# Patient Record
Sex: Male | Born: 1960
Health system: Southern US, Community
[De-identification: ages and names within clinical notes are randomized; demographics above are authoritative.]

## PROBLEM LIST (undated history)

## (undated) DIAGNOSIS — E119 Type 2 diabetes mellitus without complications: Secondary | ICD-10-CM

---

## 2006-03-27 ENCOUNTER — Emergency Department (HOSPITAL_COMMUNITY): Admission: EM | Admit: 2006-03-27 | Discharge: 2006-03-27 | Payer: Self-pay | Admitting: Emergency Medicine

## 2008-08-14 ENCOUNTER — Emergency Department (HOSPITAL_COMMUNITY): Admission: EM | Admit: 2008-08-14 | Discharge: 2008-08-14 | Payer: Self-pay | Admitting: Emergency Medicine

## 2008-09-06 ENCOUNTER — Ambulatory Visit: Payer: Self-pay | Admitting: Cardiology

## 2008-09-06 ENCOUNTER — Inpatient Hospital Stay (HOSPITAL_COMMUNITY): Admission: EM | Admit: 2008-09-06 | Discharge: 2008-09-11 | Payer: Self-pay | Admitting: Emergency Medicine

## 2008-09-23 ENCOUNTER — Ambulatory Visit: Payer: Self-pay | Admitting: Internal Medicine

## 2008-09-23 ENCOUNTER — Encounter (INDEPENDENT_AMBULATORY_CARE_PROVIDER_SITE_OTHER): Payer: Self-pay | Admitting: Family Medicine

## 2008-09-23 LAB — CONVERTED CEMR LAB: Microalb, Ur: 2.57 mg/dL — ABNORMAL HIGH (ref 0.00–1.89)

## 2008-10-08 ENCOUNTER — Ambulatory Visit: Payer: Self-pay | Admitting: *Deleted

## 2008-10-26 ENCOUNTER — Encounter (INDEPENDENT_AMBULATORY_CARE_PROVIDER_SITE_OTHER): Payer: Self-pay | Admitting: Family Medicine

## 2008-10-26 ENCOUNTER — Ambulatory Visit: Payer: Self-pay | Admitting: Internal Medicine

## 2008-10-26 LAB — CONVERTED CEMR LAB
ALT: 12 units/L (ref 0–53)
AST: 12 units/L (ref 0–37)
Alkaline Phosphatase: 65 units/L (ref 39–117)
Cholesterol: 138 mg/dL (ref 0–200)
Creatinine, Ser: 1.2 mg/dL (ref 0.40–1.50)
Total Bilirubin: 0.7 mg/dL (ref 0.3–1.2)
Total CHOL/HDL Ratio: 3.6
VLDL: 20 mg/dL (ref 0–40)

## 2008-11-26 ENCOUNTER — Ambulatory Visit: Payer: Self-pay | Admitting: Family Medicine

## 2009-01-26 ENCOUNTER — Ambulatory Visit: Payer: Self-pay | Admitting: Internal Medicine

## 2009-04-23 ENCOUNTER — Ambulatory Visit: Payer: Self-pay | Admitting: Family Medicine

## 2009-04-23 ENCOUNTER — Encounter (INDEPENDENT_AMBULATORY_CARE_PROVIDER_SITE_OTHER): Payer: Self-pay | Admitting: Adult Health

## 2009-04-23 LAB — CONVERTED CEMR LAB
AST: 12 units/L (ref 0–37)
Albumin: 4.2 g/dL (ref 3.5–5.2)
Alkaline Phosphatase: 48 units/L (ref 39–117)
BUN: 19 mg/dL (ref 6–23)
HDL: 33 mg/dL — ABNORMAL LOW (ref >39)
LDL Cholesterol: 47 mg/dL (ref 0–99)
Potassium: 4.3 meq/L (ref 3.5–5.3)
Sodium: 142 meq/L (ref 135–145)
Total CHOL/HDL Ratio: 2.9

## 2009-07-23 ENCOUNTER — Ambulatory Visit: Payer: Self-pay | Admitting: Family Medicine

## 2010-12-26 LAB — GLUCOSE, CAPILLARY: Glucose-Capillary: 223 mg/dL — ABNORMAL HIGH (ref 70–99)

## 2010-12-26 LAB — BASIC METABOLIC PANEL
Calcium: 8.4 mg/dL (ref 8.4–10.5)
Calcium: 8.8 mg/dL (ref 8.4–10.5)
Chloride: 102 mEq/L (ref 96–112)
Creatinine, Ser: 1.05 mg/dL (ref 0.4–1.5)
Creatinine, Ser: 1.05 mg/dL (ref 0.4–1.5)
GFR calc Af Amer: >60 mL/min (ref >60)
GFR calc Af Amer: >60 mL/min (ref >60)
GFR calc non Af Amer: >60 mL/min (ref >60)

## 2011-01-24 NOTE — Discharge Summary (Signed)
NAMEARHAN, MCMANAMON                ACCOUNT NO.:  [id number]   MEDICAL RECORD NO.:  [id number]          PATIENT TYPE:  INP   LOCATION:  4707                         FACILITY:  [facility]   PHYSICIAN:  Altha Harm, MDDATE OF BIRTH:  08-16-1961   DATE OF ADMISSION:  09/06/2008  DATE OF DISCHARGE:  09/11/2008                               DISCHARGE SUMMARY   DISCHARGE DISPOSITION:  Home.   FINAL DISCHARGE DIAGNOSES:  1. Diabetes type 2.  2. Diabetic ketoacidosis, resolved.  3. Hyperlipidemia.  4. Sinus tachycardia.   DISCHARGE MEDICATIONS:  1. Glipizide 10 mg p.o. b.i.d.  2. Aspirin 81 mg p.o. daily.  3. Mag oxide 400 mg p.o. t.i.d.  4. Pravastatin 20 mg p.o. nightly.  5. Lantus 10 units subcu at bedtime.  6. Lisinopril 5 mg p.o. daily.   CONSULTANTS:  Madolyn Frieze. Jens Som, MD, [facility], Cardiology.   PROCEDURE:  None.   DIAGNOSTIC STUDIES:  1. Portable chest x-ray, two-view done on admission which shows no      active cardiopulmonary disease.  2. Portable chest x-ray done after a PICC line placement, which shows      PICC line in the SVC.   CODE STATUS:  Full code.   ALLERGIES:  No known drug allergies.   Primary care Nayson Traweek initially unassigned.  The patient now signed to  [facility], an appointment on September 24, 2007.   CHIEF COMPLAINT:  New onset diabetes, DKA.   HISTORY OF PRESENT ILLNESS:  Please refer to the H and P dictated by Dr.  Allena Katz for details of the HPI.   HOSPITAL COURSE:  1. The patient was new onset diabetes and diabetic ketoacidosis.  The      patient was admitted and placed on insulin drip.  He had resolution      of his diabetic ketoacidosis and was transitioned onto bolus and      basal insulin.  A GAD-69 was checked and the patient was found to      be in normal limits with eliminated type 1 as the cause of this.      The patient was then started on oral anti-hypoglycemics and will be      sent home on glipizide 10 mg b.i.d. in addition to a  bolus of      Lantus in the evenings.  The patient received diabetic education      while hospitalized in terms of symptoms of hypoglycemia, diet, and      carbohydrate exchanges, and expected surveillance since followup      for his diagnosis.  2. Hyperlipidemia.  The patient was found to have a markedly elevated      lipid level hospitalized and was started on pravastatin.  He is to      continue to have this cholesterol checked and see if this requires      titration of his medication.  3. Sinus tachycardia.  The patient was found to be mildly sinus      tachycardic.  The patient was set to be discharged; however, his      sister who  he has designated as his spokesperson advocate requested      a consult by Cardiology.  The patient was seen by Cardiology who      concurred that there is no further workup necessary and recommended      echo to be done as an outpatient.  I will defer to [facility] to      further evaluate this patient with an echocardiogram.  The TSH was      found to be within normal limits.  4. Prior history of hypertension.  The patient reports that he had a      prior history of hypertension; however, during this hospitalization      here, he was found to be only mildly hypertensive with diastolic      blood pressures in the 90s.  The patient was started on lisinopril      5 mg p.o. daily.   DIETARY RESTRICTIONS:  The patient should be on a diabetic, low-  cholesterol, low-sodium diet.   PHYSICAL RESTRICTIONS:  None.   FOLLOWUP:  The patient has an appointment scheduled with [facility] for  September 24, 2007, at 9:30 a.m. and is to follow up at that time.      Altha Harm, MD  Electronically Signed     MAM/MEDQ  D:  09/11/2008  T:  09/11/2008  Job:  [id number]   cc:   Melvern Banker

## 2011-01-24 NOTE — Consult Note (Signed)
[name], [name]                ACCOUNT NO.:  [id number]   MEDICAL RECORD NO.:  [id number]          PATIENT TYPE:  INP   LOCATION:  4707                         FACILITY:  [facility]   PHYSICIAN:  Isaac Frieze. Jens Som, MD, FACCDATE OF BIRTH:  1961/05/25   DATE OF CONSULTATION:  09/10/2008  DATE OF DISCHARGE:                                 CONSULTATION   Isaac [name] is a 50 year old gentleman with recently diagnosed diabetes  mellitus, who I am asked to evaluate for tachycardia.  The patient has  no prior cardiac history.  He typically does not have dyspnea on  exertion, orthopnea, PND, pedal edema, presyncope, syncope, or  exertional chest pain.  He occasionally feels brief skipped in his  heart at home, but these are not sustained nor are they particularly  bothersome.  The patient was admitted to [facility] by the  [facility] on September 06, 2008.  At that time, he was  complaining of abdominal pain.  The pain was in the left upper abdomen.  This was associated with decreased p.o. intake, weakness, and mild  dizziness, particularly with standing.  Note, he was not having chest  pain, palpitations, or increased shortness of breath.  Also note that he  had been seen recently for an abscess in his left inner thigh, which had  been treated and improved.  At the time of his admission, he was found  to be in DKA.  His initial potassium was 5.5 with a BUN of 24 and  creatinine of 2.08.  His glucose was 516 with a CO2 of 8.  He also had  ketones that were moderate in his blood.  Amylase was elevated at 404  and lipase was elevated at 302.  Also note that he had mild elevation in  liver functions of alkaline phosphatase at 120 and bilirubin at 2.0.  The patient was treated with IV fluids, as well as an insulin drip and  improved significantly.  He now feels back to baseline with no  complaints; however, his heart rate has been noted to be elevated in the  100-120 range.  Because  of the above, we were asked to further evaluate.  Note, he states he had a brief episode of palpitations in the hospital  but nothing sustained.  He has not had chest pain or shortness of  breath.  Also note that he had initial set of markers when he presented  to the hospital that were normal.   His present medications include:  1. Subcutaneous heparin.  2. Aspirin 81 mg p.o. daily.  3. Magnesium oxide 400 mg p.o. t.i.d.  4. Protonix 40 mg p.o. daily.  5. Glipizide 5 mg p.o. b.i.d. and p.r.n. medications.   He has no known drug allergies.   SOCIAL HISTORY:  He has remote history of tobacco use but has not smoked  in years by his report.  He occasionally consumes alcohol.  He denies  any drug use.  He is single.  He is unemployed at present.   His family history is negative for coronary artery disease by  his report  in his immediate family.  There is a history of hypertension.   PAST MEDICAL HISTORY:  The patient does have a history of hypertension  but now diabetes mellitus.  There is no hyperlipidemia.  He does have a  history of asthma.  He has had previous surgery for a lazy eye.   REVIEW OF SYSTEMS:  He denies any headaches or fevers or chills.  There  is no productive cough or hemoptysis.  There is no dysphagia,  odynophagia, melena, or hematochezia.  There is no dysuria or hematuria.  There is no rash or seizure activity.  There is no orthopnea, PND, or  pedal edema.  His symptoms that were present on admission are now  absent.  Remaining systems are negative.   PHYSICAL EXAMINATION:  VITAL SIGNS:  Today shows a blood pressure of  125/82  and his pulse is 52.  His temperature is 97.4.  He is 97% on  room air.  GENERAL:  He is well developed and mildly obese.  He is in no acute  distress at present. He does not appear to be depressed.  There is no  peripheral clubbing.  SKIN:  Warm and dry.  BACK:  Normal.  HEENT:  Normal with normal eyelids.  NECK:  Supple with normal  upstroke bilaterally.  No bruits heard.  There  is no jugular venous distention.  I cannot appreciate thyromegaly.  CHEST:  Clear to auscultation.  No expansion.  CARDIOVASCULAR:  Regular rate and rhythm with normal S1 and S2.  There  are no murmurs, rubs, or gallops noted.  ABDOMEN:  Nontender, nondistended, positive bowel sounds.  No  hepatosplenomegaly.  No mass appreciated.  There is no abdominal bruit.  He has 2+ femoral pulses bilaterally.  No bruits.  EXTREMITIES:  No edema that I can palpate.  No cords.  He has 2+  dorsalis pedis pulses bilaterally.  NEUROLOGIC:  Grossly intact.   His electrocardiogram on September 06, 2008, showed a sinus tachycardia,  rate of 117.  The axis was normal.  There was inferolateral T-wave  inversion.  There is right atrial enlargement.  Again, he had 1 set of  markers when he came in that were normal.  His sodium today is 139 with  a potassium of 3.8.  His BUN and creatinine are 10 and 0.82.  He has had  1 or 2 blood cultures that are positive for Gram-positive rods.  Chest x-  ray showed no active cardiopulmonary disease.  His last BNP on September 07, 2008, showed that his liver functions were minimally elevated with  an SGOT of 69 and SGPT of 75.   DIAGNOSES:  1. Tachycardia - I am asked to evaluate Isaac [name] for a sinus      tachycardia.  He is not having a significant amount of symptoms.  I      think this is most likely related to his initial presentation of      diabetic ketoacidosis and dehydration.  I do not think this will      require further therapy.  For now, we would recommend checking a      TSH.  Note, his hemoglobin/hematocrit were normal at the time of      admission.  Also note that his enzymes are negative x1, therefore,      excluding the myocardial infarction as a cause of his diabetic      ketoacidosis.  I would plan to check an  echocardiogram to quantify      his left ventricular function.  This could be certainly done as  an      outpatient.  If it is normal, then I would not pursue further      cardiac evaluation.  2. Newly diagnosed diabetes mellitus - management per the primary      service.  The etiology of his diabetic ketoacidosis is unclear.      Again, his enzymes were negative.  He did have a small abscess      drained from his left thigh and certainly infection could      potentially have caused the above.  I will leave management of this      and his positive blood culture to the primary team.  3. Mildly increased liver functions - management per primary care.  4. We would be happy to follow while he is in the hospital.      Isaac Frieze. Jens Som, MD, [facility]  Electronically Signed     BSC/MEDQ  D:  09/10/2008  T:  09/11/2008  Job:  [id number]

## 2011-01-24 NOTE — H&P (Signed)
Isaac [name], Isaac [name]                ACCOUNT NO.:  [id number]   MEDICAL RECORD NO.:  [id number]          PATIENT TYPE:  INP   LOCATION:  4707                         FACILITY:  [facility]   PHYSICIAN:  Joylene John, MD       DATE OF BIRTH:  1960-12-10   DATE OF ADMISSION:  09/06/2008  DATE OF DISCHARGE:                              HISTORY & PHYSICAL   REASON FOR ADMISSION:  New onset diabetes and diabetic ketoacidosis.   HISTORY OF PRESENT ILLNESS:  This is a 50 year old African American male  with no significant past medical history coming in with 2 weeks of  abdominal pain, decreased p.o. intake, increased weakness, increased  urination, blurred vision, and thrush.  According to the patient, he has  not seen a doctor for regular physical or had lab work in several years.  He was recently here in the ER for a groin abscess which was drained;  however, no labs were done at that time.  The patient denies any fevers,  chills, shortness of breath, or problems with urination.  The patient  denies any chest pain.  No similar episodes in the past.   PAST MEDICAL HISTORY:  No significant past medical history except for  high blood pressure; however, the patient self discontinued his  medications since he was not feeling well.   SOCIAL HISTORY:  He is currently laid off.  He used to work in a Journalist, newspaper.  Ex-smoker, last smoke was more than a decade ago.  No tobacco or  alcohol.   FAMILY HISTORY:  Positive for diabetes, high blood pressure, and  coronary artery disease.   ALLERGIES:  No allergies to food or medicine.   REVIEW OF SYSTEM:  Positive for weakness, increased thrush, increased  urination, and blurred vision along with abdominal pain, otherwise a 14-  point review of system was negative.   PHYSICAL EXAMINATION:  VITAL SIGNS:  Temperature is 96.3, pulse ranging  from 130-119, blood pressure 144/91 to 136/108, and respirations 20-18.  The patient is saturating 100% on room air.  GENERAL:  This is an [place] male in no acute distress, awake  and alert, talking in full sentences.  HEENT:  No icterus appreciated.  No pallor appreciated.  However, he  does have dry mucous membranes and poor dentition.  NECK:  No JVD appreciated.  LUNGS:  Clear to auscultation.  CARDIOVASCULAR:  Regular rate and rhythm.  He is tachycardic.  ABDOMEN:  Soft.  Bowel sounds are present.  There is mild epigastric  tenderness.  No rebound or guarding appreciated.  EXTREMITIES:  No lower extremity edema appreciated.   Pertinent imaging shows a chest x-ray without any acute disease.  Lab  shows a white count of 10.5, neutrophil 88%, hemoglobin 16.8, crit 51.8,  and platelets 236.  Sodium 131, potassium 5.5, chloride 96, bicarb 8,  BUN 24, creatinine 2.08, albumin 3.8, glucose 516, calcium 9.3,  corrected sodium is 138, anion gap is 29, AST is 14, and ALT is 21.   ASSESSMENT AND PLAN:  1. This is a 50 year old male, who was  coming in with new onset of      diabetic ketoacidosis.  One plan is to admit the patient to a tele      bed, do serial cardiac enzymes to make sure that this is not a      cardiac event that regard him going into diabetic ketoacidosis.      Two is to hydrate this patient.  The patient has received 2 liters      of normal saline according to the ER MD.  We will make sure that he      get normal saline at about 250 mL an hour.  Once his chemistry      sugar is less than 250, we will change to D5 0.5 normal.  The      patient will be kept n.p.o. for next 24 hours until the abdominal      pain resolves.  After that diet can be slowly resumed.  Labs will      be checked frequently to make sure his potassium and magnesium are      within normal limits and replete as needed.  We will also check      urinalysis and culture to make sure that the patient does not have      a urinary tract infection.  We will check serum ketones, serum      osmolality, ABG, and  hemoglobin A1c.  We will get C-peptide level      and a GAD level as well since this is new onset diabetes to help to      differentiate whether this is type 1 versus type 2, even though      type 2 is more common, type 1 can happen.  2. Abdominal pain.  We will keep the patient n.p.o. for now.  We will      check amylase and lipase level.  If abdominal pain persists, then      to consider CT abdomen at a later time.  3. Blood pressure.  Blood pressure is not very high, systolic in the      142 range right now.  We will hold blood pressure medications right      now.  We will need to reassess at a later date and if the blood      pressure medications need to be started, I would consider an ACE      inhibitor since the patient is a diabetic especially if he has      protein in his urine.  4. The patient will need diabetic education and social worker to help      deal with his new-onset diabetes.  5. The patient is currently on insulin drip.  Once anion gap closes,      then he can be transitioned to standing insulin with food      Joylene John, MD  Electronically Signed     RP/MEDQ  D:  09/06/2008  T:  09/07/2008  Job:  [id number]

## 2011-06-16 LAB — COMPREHENSIVE METABOLIC PANEL
ALT: 14 U/L (ref 0–53)
ALT: 14 U/L (ref 0–53)
ALT: 21 U/L (ref 0–53)
AST: 12 U/L (ref 0–37)
Alkaline Phosphatase: 80 U/L (ref 39–117)
Alkaline Phosphatase: 86 U/L (ref 39–117)
BUN: 11 mg/dL (ref 6–23)
BUN: 9 mg/dL (ref 6–23)
CO2: 23 mEq/L (ref 19–32)
CO2: 24 mEq/L (ref 19–32)
CO2: 27 mEq/L (ref 19–32)
CO2: 8 mEq/L — CL (ref 19–32)
Calcium: 7.8 mg/dL — ABNORMAL LOW (ref 8.4–10.5)
Calcium: 7.8 mg/dL — ABNORMAL LOW (ref 8.4–10.5)
Calcium: 9.3 mg/dL (ref 8.4–10.5)
Chloride: 101 mEq/L (ref 96–112)
Creatinine, Ser: 0.78 mg/dL (ref 0.4–1.5)
Creatinine, Ser: 1.34 mg/dL (ref 0.4–1.5)
Creatinine, Ser: 2.08 mg/dL — ABNORMAL HIGH (ref 0.4–1.5)
GFR calc non Af Amer: 35 mL/min — ABNORMAL LOW (ref >60)
GFR calc non Af Amer: 58 mL/min — ABNORMAL LOW (ref >60)
GFR calc non Af Amer: >60 mL/min (ref >60)
GFR calc non Af Amer: >60 mL/min (ref >60)
Glucose, Bld: 135 mg/dL — ABNORMAL HIGH (ref 70–99)
Glucose, Bld: 154 mg/dL — ABNORMAL HIGH (ref 70–99)
Glucose, Bld: 160 mg/dL — ABNORMAL HIGH (ref 70–99)
Glucose, Bld: 516 mg/dL (ref 70–99)
Glucose, Bld: 93 mg/dL (ref 70–99)
Potassium: 2.8 mEq/L — ABNORMAL LOW (ref 3.5–5.1)
Potassium: 4.2 mEq/L (ref 3.5–5.1)
Sodium: 136 mEq/L (ref 135–145)
Sodium: 139 mEq/L (ref 135–145)
Total Bilirubin: 0.5 mg/dL (ref 0.3–1.2)
Total Bilirubin: 2 mg/dL — ABNORMAL HIGH (ref 0.3–1.2)
Total Protein: 5.1 g/dL — ABNORMAL LOW (ref 6.0–8.3)
Total Protein: 6.2 g/dL (ref 6.0–8.3)

## 2011-06-16 LAB — BASIC METABOLIC PANEL
BUN: 10 mg/dL (ref 6–23)
BUN: 3 mg/dL — ABNORMAL LOW (ref 6–23)
BUN: 4 mg/dL — ABNORMAL LOW (ref 6–23)
BUN: 4 mg/dL — ABNORMAL LOW (ref 6–23)
CO2: 24 mEq/L (ref 19–32)
CO2: 24 mEq/L (ref 19–32)
CO2: 24 mEq/L (ref 19–32)
CO2: 25 mEq/L (ref 19–32)
CO2: 26 mEq/L (ref 19–32)
CO2: 26 mEq/L (ref 19–32)
Calcium: 7.7 mg/dL — ABNORMAL LOW (ref 8.4–10.5)
Calcium: 7.9 mg/dL — ABNORMAL LOW (ref 8.4–10.5)
Calcium: 8 mg/dL — ABNORMAL LOW (ref 8.4–10.5)
Calcium: 8.1 mg/dL — ABNORMAL LOW (ref 8.4–10.5)
Calcium: 8.1 mg/dL — ABNORMAL LOW (ref 8.4–10.5)
Calcium: 8.3 mg/dL — ABNORMAL LOW (ref 8.4–10.5)
Calcium: 8.8 mg/dL (ref 8.4–10.5)
Chloride: 100 mEq/L (ref 96–112)
Chloride: 102 mEq/L (ref 96–112)
Chloride: 105 mEq/L (ref 96–112)
Chloride: 95 mEq/L — ABNORMAL LOW (ref 96–112)
Chloride: 95 mEq/L — ABNORMAL LOW (ref 96–112)
Chloride: 98 mEq/L (ref 96–112)
Chloride: 98 mEq/L (ref 96–112)
Creatinine, Ser: 0.88 mg/dL (ref 0.4–1.5)
Creatinine, Ser: 0.92 mg/dL (ref 0.4–1.5)
Creatinine, Ser: 0.96 mg/dL (ref 0.4–1.5)
Creatinine, Ser: 1.16 mg/dL (ref 0.4–1.5)
Creatinine, Ser: 1.17 mg/dL (ref 0.4–1.5)
Creatinine, Ser: 1.45 mg/dL (ref 0.4–1.5)
GFR calc Af Amer: 59 mL/min — ABNORMAL LOW (ref >60)
GFR calc Af Amer: >60 mL/min (ref >60)
GFR calc Af Amer: >60 mL/min (ref >60)
GFR calc Af Amer: >60 mL/min (ref >60)
GFR calc Af Amer: >60 mL/min (ref >60)
GFR calc non Af Amer: 48 mL/min — ABNORMAL LOW (ref >60)
GFR calc non Af Amer: 53 mL/min — ABNORMAL LOW (ref >60)
GFR calc non Af Amer: >60 mL/min (ref >60)
GFR calc non Af Amer: >60 mL/min (ref >60)
GFR calc non Af Amer: >60 mL/min (ref >60)
GFR calc non Af Amer: >60 mL/min (ref >60)
GFR calc non Af Amer: >60 mL/min (ref >60)
Glucose, Bld: 124 mg/dL — ABNORMAL HIGH (ref 70–99)
Glucose, Bld: 139 mg/dL — ABNORMAL HIGH (ref 70–99)
Glucose, Bld: 139 mg/dL — ABNORMAL HIGH (ref 70–99)
Glucose, Bld: 152 mg/dL — ABNORMAL HIGH (ref 70–99)
Glucose, Bld: 154 mg/dL — ABNORMAL HIGH (ref 70–99)
Glucose, Bld: 266 mg/dL — ABNORMAL HIGH (ref 70–99)
Glucose, Bld: 315 mg/dL — ABNORMAL HIGH (ref 70–99)
Glucose, Bld: 322 mg/dL — ABNORMAL HIGH (ref 70–99)
Potassium: 2.8 mEq/L — ABNORMAL LOW (ref 3.5–5.1)
Potassium: 3.5 mEq/L (ref 3.5–5.1)
Potassium: 4.1 mEq/L (ref 3.5–5.1)
Sodium: 130 mEq/L — ABNORMAL LOW (ref 135–145)
Sodium: 135 mEq/L (ref 135–145)
Sodium: 135 mEq/L (ref 135–145)
Sodium: 136 mEq/L (ref 135–145)
Sodium: 137 mEq/L (ref 135–145)
Sodium: 138 mEq/L (ref 135–145)
Sodium: 138 mEq/L (ref 135–145)
Sodium: 144 mEq/L (ref 135–145)

## 2011-06-16 LAB — CK TOTAL AND CKMB (NOT AT ARMC)
CK, MB: 1.5 ng/mL (ref 0.3–4.0)
Relative Index: INVALID (ref 0.0–2.5)
Total CK: 38 U/L (ref 7–232)

## 2011-06-16 LAB — LIPID PANEL
Cholesterol: 205 mg/dL — ABNORMAL HIGH (ref 0–200)
Total CHOL/HDL Ratio: 7.6 RATIO
VLDL: 50 mg/dL — ABNORMAL HIGH (ref 0–40)

## 2011-06-16 LAB — CBC
HCT: 38.3 % — ABNORMAL LOW (ref 39.0–52.0)
Hemoglobin: 13.1 g/dL (ref 13.0–17.0)
Hemoglobin: 16.8 g/dL (ref 13.0–17.0)
MCHC: 32.4 g/dL (ref 30.0–36.0)
MCHC: 34.2 g/dL (ref 30.0–36.0)
MCV: 89.6 fL (ref 78.0–100.0)
MCV: 92.6 fL (ref 78.0–100.0)
RBC: 4.28 MIL/uL (ref 4.22–5.81)
RBC: 5.6 MIL/uL (ref 4.22–5.81)

## 2011-06-16 LAB — GLUCOSE, CAPILLARY
Glucose-Capillary: 106 mg/dL — ABNORMAL HIGH (ref 70–99)
Glucose-Capillary: 123 mg/dL — ABNORMAL HIGH (ref 70–99)
Glucose-Capillary: 126 mg/dL — ABNORMAL HIGH (ref 70–99)
Glucose-Capillary: 126 mg/dL — ABNORMAL HIGH (ref 70–99)
Glucose-Capillary: 129 mg/dL — ABNORMAL HIGH (ref 70–99)
Glucose-Capillary: 131 mg/dL — ABNORMAL HIGH (ref 70–99)
Glucose-Capillary: 131 mg/dL — ABNORMAL HIGH (ref 70–99)
Glucose-Capillary: 134 mg/dL — ABNORMAL HIGH (ref 70–99)
Glucose-Capillary: 138 mg/dL — ABNORMAL HIGH (ref 70–99)
Glucose-Capillary: 140 mg/dL — ABNORMAL HIGH (ref 70–99)
Glucose-Capillary: 145 mg/dL — ABNORMAL HIGH (ref 70–99)
Glucose-Capillary: 159 mg/dL — ABNORMAL HIGH (ref 70–99)
Glucose-Capillary: 160 mg/dL — ABNORMAL HIGH (ref 70–99)
Glucose-Capillary: 163 mg/dL — ABNORMAL HIGH (ref 70–99)
Glucose-Capillary: 181 mg/dL — ABNORMAL HIGH (ref 70–99)
Glucose-Capillary: 186 mg/dL — ABNORMAL HIGH (ref 70–99)
Glucose-Capillary: 195 mg/dL — ABNORMAL HIGH (ref 70–99)
Glucose-Capillary: 197 mg/dL — ABNORMAL HIGH (ref 70–99)
Glucose-Capillary: 200 mg/dL — ABNORMAL HIGH (ref 70–99)
Glucose-Capillary: 221 mg/dL — ABNORMAL HIGH (ref 70–99)
Glucose-Capillary: 222 mg/dL — ABNORMAL HIGH (ref 70–99)
Glucose-Capillary: 224 mg/dL — ABNORMAL HIGH (ref 70–99)
Glucose-Capillary: 246 mg/dL — ABNORMAL HIGH (ref 70–99)
Glucose-Capillary: 258 mg/dL — ABNORMAL HIGH (ref 70–99)
Glucose-Capillary: 301 mg/dL — ABNORMAL HIGH (ref 70–99)

## 2011-06-16 LAB — CULTURE, ROUTINE-ABSCESS

## 2011-06-16 LAB — PROTIME-INR
INR: 1.1 (ref 0.00–1.49)
Prothrombin Time: 14.1 seconds (ref 11.6–15.2)

## 2011-06-16 LAB — RAPID URINE DRUG SCREEN, HOSP PERFORMED
Cocaine: NOT DETECTED
Tetrahydrocannabinol: NOT DETECTED

## 2011-06-16 LAB — DIFFERENTIAL
Basophils Absolute: 0 10*3/uL (ref 0.0–0.1)
Eosinophils Absolute: 0 10*3/uL (ref 0.0–0.7)
Lymphocytes Relative: 7 % — ABNORMAL LOW (ref 12–46)
Lymphs Abs: 0.7 10*3/uL (ref 0.7–4.0)
Neutrophils Relative %: 88 % — ABNORMAL HIGH (ref 43–77)

## 2011-06-16 LAB — CULTURE, BLOOD (ROUTINE X 2): Culture: NO GROWTH

## 2011-06-16 LAB — POCT I-STAT 3, ART BLOOD GAS (G3+)
Acid-base deficit: 19 mmol/L — ABNORMAL HIGH (ref 0.0–2.0)
O2 Saturation: 98 %
Patient temperature: 96.3

## 2011-06-16 LAB — URINALYSIS, ROUTINE W REFLEX MICROSCOPIC
Glucose, UA: >1000 mg/dL — AB
Leukocytes, UA: NEGATIVE
Nitrite: NEGATIVE
Protein, ur: 30 mg/dL — AB

## 2011-06-16 LAB — URINE CULTURE: Colony Count: NO GROWTH

## 2011-06-16 LAB — MAGNESIUM
Magnesium: 1.8 mg/dL (ref 1.5–2.5)
Magnesium: 1.9 mg/dL (ref 1.5–2.5)
Magnesium: 2 mg/dL (ref 1.5–2.5)
Magnesium: 2.1 mg/dL (ref 1.5–2.5)

## 2011-06-16 LAB — PHOSPHORUS: Phosphorus: 2.4 mg/dL (ref 2.3–4.6)

## 2011-06-16 LAB — KETONES, QUALITATIVE

## 2011-06-16 LAB — URINE MICROSCOPIC-ADD ON

## 2011-06-16 LAB — AMYLASE: Amylase: 404 U/L — ABNORMAL HIGH (ref 27–131)

## 2015-12-13 DIAGNOSIS — E119 Type 2 diabetes mellitus without complications: Secondary | ICD-10-CM | POA: Diagnosis not present

## 2015-12-13 DIAGNOSIS — Y990 Civilian activity done for income or pay: Secondary | ICD-10-CM | POA: Diagnosis not present

## 2015-12-13 DIAGNOSIS — Y9289 Other specified places as the place of occurrence of the external cause: Secondary | ICD-10-CM | POA: Insufficient documentation

## 2015-12-13 DIAGNOSIS — S61412A Laceration without foreign body of left hand, initial encounter: Secondary | ICD-10-CM | POA: Insufficient documentation

## 2015-12-13 DIAGNOSIS — Z23 Encounter for immunization: Secondary | ICD-10-CM | POA: Diagnosis not present

## 2015-12-13 DIAGNOSIS — W231XXA Caught, crushed, jammed, or pinched between stationary objects, initial encounter: Secondary | ICD-10-CM | POA: Diagnosis not present

## 2015-12-13 DIAGNOSIS — Y9389 Activity, other specified: Secondary | ICD-10-CM | POA: Insufficient documentation

## 2015-12-14 ENCOUNTER — Emergency Department (HOSPITAL_COMMUNITY)
Admission: EM | Admit: 2015-12-14 | Discharge: 2015-12-14 | Disposition: A | Discharge status: Home / Self Care | Payer: Worker's Compensation | Attending: Emergency Medicine | Admitting: Emergency Medicine

## 2015-12-14 ENCOUNTER — Encounter (HOSPITAL_COMMUNITY): Payer: Self-pay

## 2015-12-14 ENCOUNTER — Emergency Department (HOSPITAL_COMMUNITY): Payer: Worker's Compensation

## 2015-12-14 DIAGNOSIS — S61412A Laceration without foreign body of left hand, initial encounter: Secondary | ICD-10-CM

## 2015-12-14 HISTORY — DX: Type 2 diabetes mellitus without complications: E11.9

## 2015-12-14 MED ORDER — HYDROCODONE-ACETAMINOPHEN 5-325 MG PO TABS: 1.0000 | ORAL_TABLET | Freq: Four times a day (QID) | ORAL | Status: DC | PRN

## 2015-12-14 MED ORDER — LIDOCAINE HCL (PF) 1 % IJ SOLN
5.0000 mL | Freq: Once | INTRAMUSCULAR | Status: AC
  Administered 2015-12-14: 5 mL
  Filled 2015-12-14: qty 5

## 2015-12-14 MED ORDER — TETANUS-DIPHTH-ACELL PERTUSSIS 5-2.5-18.5 LF-MCG/0.5 IM SUSP
0.5000 mL | Freq: Once | INTRAMUSCULAR | Status: AC
  Administered 2015-12-14: 0.5 mL via INTRAMUSCULAR
  Filled 2015-12-14: qty 0.5

## 2015-12-14 MED ORDER — CEPHALEXIN 500 MG PO CAPS: 500.0000 mg | ORAL_CAPSULE | Freq: Four times a day (QID) | ORAL | Status: DC

## 2015-12-14 NOTE — ED Notes (Signed)
Pt works at [employer] and sts chain broke and a large heavy beam fell on left hand, bleeding controlled, bandage applied pta to ed.

## 2015-12-14 NOTE — Discharge Instructions (Signed)
Laceration Care, Adult °A laceration is a cut that goes through all of the layers of the skin and into the tissue that is right under the skin. Some lacerations heal on their own. Others need to be closed with stitches (sutures), staples, skin adhesive strips, or skin glue. Proper laceration care minimizes the risk of infection and helps the laceration to heal better. °HOW TO CARE FOR YOUR LACERATION °If sutures or staples were used: °· Keep the wound clean and dry. °· If you were given a bandage (dressing), you should change it at least one time per day or as told by your health care provider. You should also change it if it becomes wet or dirty. °· Keep the wound completely dry for the first 24 hours or as told by your health care provider. After that time, you may shower or bathe. However, make sure that the wound is not soaked in water until after the sutures or staples have been removed. °· Clean the wound one time each day or as told by your health care provider: °¨ Wash the wound with soap and water. °¨ Rinse the wound with water to remove all soap. °¨ Pat the wound dry with a clean towel. Do not rub the wound. °· After cleaning the wound, apply a thin layer of antibiotic ointment as told by your health care provider. This will help to prevent infection and keep the dressing from sticking to the wound. °· Have the sutures or staples removed as told by your health care provider. °If skin adhesive strips were used: °· Keep the wound clean and dry. °· If you were given a bandage (dressing), you should change it at least one time per day or as told by your health care provider. You should also change it if it becomes dirty or wet. °· Do not get the skin adhesive strips wet. You may shower or bathe, but be careful to keep the wound dry. °· If the wound gets wet, pat it dry with a clean towel. Do not rub the wound. °· Skin adhesive strips fall off on their own. You may trim the strips as the wound heals. Do not  remove skin adhesive strips that are still stuck to the wound. They will fall off in time. °If skin glue was used: °· Try to keep the wound dry, but you may briefly wet it in the shower or bath. Do not soak the wound in water, such as by swimming. °· After you have showered or bathed, gently pat the wound dry with a clean towel. Do not rub the wound. °· Do not do any activities that will make you sweat heavily until the skin glue has fallen off on its own. °· Do not apply liquid, cream, or ointment medicine to the wound while the skin glue is in place. Using those may loosen the film before the wound has healed. °· If you were given a bandage (dressing), you should change it at least one time per day or as told by your health care provider. You should also change it if it becomes dirty or wet. °· If a dressing is placed over the wound, be careful not to apply tape directly over the skin glue. Doing that may cause the glue to be pulled off before the wound has healed. °· Do not pick at the glue. The skin glue usually remains in place for 5-10 days, then it falls off of the skin. °General Instructions °· Take over-the-counter and prescription   medicines only as told by your health care provider.  If you were prescribed an antibiotic medicine or ointment, take or apply it as told by your doctor. Do not stop using it even if your condition improves.  To help prevent scarring, make sure to cover your wound with sunscreen whenever you are outside after stitches are removed, after adhesive strips are removed, or when glue remains in place and the wound is healed. Make sure to wear a sunscreen of at least 30 SPF.  Do not scratch or pick at the wound.  Keep all follow-up visits as told by your health care provider. This is important.  Check your wound every day for signs of infection. Watch for:  Redness, swelling, or pain.  Fluid, blood, or pus.  Raise (elevate) the injured area above the level of your heart  while you are sitting or lying down, if possible. SEEK MEDICAL CARE IF:  You received a tetanus shot and you have swelling, severe pain, redness, or bleeding at the injection site.  You have a fever.  A wound that was closed breaks open.  You notice a bad smell coming from your wound or your dressing.  You notice something coming out of the wound, such as wood or glass.  Your pain is not controlled with medicine.  You have increased redness, swelling, or pain at the site of your wound.  You have fluid, blood, or pus coming from your wound.  You notice a change in the color of your skin near your wound.  You need to change the dressing frequently due to fluid, blood, or pus draining from the wound.  You develop a new rash.  You develop numbness around the wound. SEEK IMMEDIATE MEDICAL CARE IF:  You develop severe swelling around the wound.  Your pain suddenly increases and is severe.  You develop painful lumps near the wound or on skin that is anywhere on your body.  You have a red streak going away from your wound.  The wound is on your hand or foot and you cannot properly move a finger or toe.  The wound is on your hand or foot and you notice that your fingers or toes look pale or bluish.   This information is not intended to replace advice given to you by your health care provider. Make sure you discuss any questions you have with your health care provider.   Document Released: 08/28/2005 Document Revised: 01/12/2015 Document Reviewed: 08/24/2014 Elsevier Interactive Patient Education 2016 [employer] Hand Contusion  A hand contusion is a deep bruise to the hand. Contusions happen when an injury causes bleeding under the skin. Signs of bruising include pain, puffiness (swelling), and discolored skin. The contusion may turn blue, purple, or yellow. HOME CARE  Put ice on the injured area.  Put ice in a plastic bag.  Place a towel between your skin and the  bag.  Leave the ice on for 15-20 minutes, 03-04 times a day.  Only take medicines as told by your doctor.  Use an elastic wrap only as told. You may remove the wrap for sleeping, showering, and bathing. Take the wrap off if you lose feeling (have numbness) in your fingers, or they turn blue or cold. Put the wrap on more loosely.  Keep the hand raised (elevated) with pillows.  Avoid using your hand too much if it is painful. GET HELP RIGHT AWAY IF:   You have more redness, puffiness, or pain in your hand.  Your puffiness or pain does not get better with medicine.  You lose feeling in your hand, or you cannot move your fingers.  Your hand turns cold or blue.  You have pain when you move your fingers.  Your hand feels warm.  Your contusion does not get better in 2 days. MAKE SURE YOU:   Understand these instructions.  Will watch this condition.  Will get help right away if you are not doing well or you get worse.   This information is not intended to replace advice given to you by your health care provider. Make sure you discuss any questions you have with your health care provider.   Document Released: 02/14/2008 Document Revised: 09/18/2014 Document Reviewed: 02/19/2012 Elsevier Interactive Patient Education [employer]

## 2015-12-14 NOTE — ED Provider Notes (Signed)
CSN: [id number]     Arrival date & time 12/13/15  2352 History   First MD Initiated Contact with Patient 12/14/15 [id number]     Chief Complaint  Patient presents with  . Hand Injury     (Consider location/radiation/quality/duration/timing/severity/associated sxs/prior Treatment) HPI Comments: Patient reports that his hand was caught between a steel beam and a chain at work.  Laceration to the medial aspect of the left hand over the sub-thenar area. Contusion noted to dorsal aspect of left hand over 4th/5th metacarpal area.  Patient is a 55 y.o. male presenting with hand injury. The history is provided by the patient. No language interpreter was used.  Hand Injury Location:  Hand Hand location:  L hand Pain details:    Quality:  Throbbing   Severity:  Mild Chronicity:  New Dislocation: no   Tetanus status:  Out of date   Past Medical History  Diagnosis Date  . Diabetes mellitus without complication (HCC)    History reviewed. No pertinent past surgical history. History reviewed. No pertinent family history. Social History  Substance Use Topics  . Smoking status: Never Smoker   . Smokeless tobacco: None  . Alcohol Use: No    Review of Systems  Skin: Positive for wound.  All other systems reviewed and are negative.     Allergies  Review of patient's allergies indicates no known allergies.  Home Medications   Prior to Admission medications   Not on File   BP 164/107 mmHg  Pulse 82  Temp(Src) 98.5 F (36.9 C) (Oral)  Resp 18  Ht 5\' 7"  (1.702 m)  Wt 93.895 kg  BMI 32.41 kg/m2  SpO2 100% Physical Exam  Constitutional: He is oriented to person, place, and time. He appears well-developed and well-nourished.  HENT:  Head: Normocephalic.  Eyes: Conjunctivae are normal.  Neck: Neck supple.  Cardiovascular: Normal rate and regular rhythm.   Pulmonary/Chest: Effort normal and breath sounds normal.  Abdominal: Soft.  Musculoskeletal: He exhibits tenderness.   Hands: Neurological: He is alert and oriented to person, place, and time.  Skin: Skin is warm and dry.  Psychiatric: He has a normal mood and affect.  Nursing note and vitals reviewed.   ED Course  Procedures (including critical care time) LACERATION REPAIR Performed by: Jimmye NormanSMITH,Burlene Montecalvo JOHN Authorized by: Jimmye NormanSMITH,Gwynne Kemnitz JOHN Consent: Verbal consent obtained. Risks and benefits: risks, benefits and alternatives were discussed Consent given by: patient Patient identity confirmed: provided demographic data Prepped and Draped in normal sterile fashion Wound explored  Laceration Location: medial aspect of left hand  Laceration Length: 2 cm  No Foreign Bodies seen or palpated  Anesthesia: local infiltration  Local anesthetic: lidocaine 1%   Anesthetic total: 4 ml  Irrigation method: syringe Amount of cleaning: standard  Skin closure: 4.0 prolene  Number of sutures: 5  Technique: simple interrupted  Patient tolerance: Patient tolerated the procedure well with no immediate complications.  Labs Review Labs Reviewed - No data to display  Imaging Review Dg Hand Complete Left  12/14/2015  CLINICAL DATA:  Pain after trauma tonight. EXAM: LEFT HAND - COMPLETE 3+ VIEW COMPARISON:  None. FINDINGS: Negative for acute fracture dislocation. There is no radiopaque foreign body. There are severe osteoarthritic changes at the first carpometacarpal articulation with mild radial subluxation. IMPRESSION: Severe first CMC arthritis.  No acute findings are evident. Electronically Signed   By: Ellery Plunkaniel R Mitchell M.D.   On: 12/14/2015 01:07   I have personally reviewed and evaluated these images and lab results as  part of my medical decision-making.   EKG Interpretation None     Radiology results reviewed and shared with patient.  MDM   Final diagnoses:  None  Tetanus updated in ED. Laceration occurred < 12 hours prior to repair. Discussed laceration care with pt and answered questions. Pt to  f-u for suture removal in 7-10 days and wound check sooner should there be signs of dehiscence or infection. Pt is hemodynamically stable with no complaints prior to dc. Antibiotic initiated d/t history of diabetes    Left hand laceration. Left hand contusion. Care instructions provided. Return precautions discussed.    Felicie Morn, NP 12/14/15 4098  Mancel Bale, MD 12/14/15 [phone]

## 2016-07-13 DIAGNOSIS — Z23 Encounter for immunization: Secondary | ICD-10-CM | POA: Diagnosis not present

## 2017-12-23 ENCOUNTER — Emergency Department (HOSPITAL_COMMUNITY): Payer: BLUE CROSS/BLUE SHIELD

## 2017-12-23 ENCOUNTER — Encounter (HOSPITAL_COMMUNITY): Payer: Self-pay

## 2017-12-23 ENCOUNTER — Emergency Department (HOSPITAL_COMMUNITY)
Admission: EM | Admit: 2017-12-23 | Discharge: 2017-12-23 | Disposition: A | Discharge status: Home / Self Care | Payer: BLUE CROSS/BLUE SHIELD | Attending: Emergency Medicine | Admitting: Emergency Medicine

## 2017-12-23 DIAGNOSIS — E119 Type 2 diabetes mellitus without complications: Secondary | ICD-10-CM | POA: Insufficient documentation

## 2017-12-23 DIAGNOSIS — M25561 Pain in right knee: Secondary | ICD-10-CM | POA: Insufficient documentation

## 2017-12-23 MED ORDER — MELOXICAM 7.5 MG PO TABS: 7.5000 mg | ORAL_TABLET | Freq: Every day | ORAL | 0 refills | Status: DC

## 2017-12-23 NOTE — ED Notes (Signed)
Pt ambulatory to room with steady gait. Pt reports gradually worsening knee pain x 1 year. Denies any trauma or injury. See EDP note for full assessment.

## 2017-12-23 NOTE — ED Provider Notes (Signed)
[facility]   CSN: [id number] Arrival date & time: 12/23/17  1124     History   Chief Complaint No chief complaint on file.   HPI Isaac [name] is a 57 y.o. male with a history of diabetes who presents today for evaluation of right knee pain.  He reports that his pain has been present for about 3 days.  He denies any fevers or chills, no nausea vomiting, he denies any other symptoms.  No leg swelling.  He denies anything that would have injured his leg.  He has tried ibuprofen at home with out relief.  He reports he sees his doctor and has no known kidney problems.    HPI  Past Medical History:  Diagnosis Date  . Diabetes mellitus without complication (HCC)     There are no active problems to display for this patient.   History reviewed. No pertinent surgical history.      Home Medications    Prior to Admission medications   Medication Sig Start Date End Date Taking? Authorizing Provider  cephALEXin (KEFLEX) 500 MG capsule Take 1 capsule (500 mg total) by mouth 4 (four) times daily. 12/14/15   Felicie MornSmith, David, NP  HYDROcodone-acetaminophen (NORCO/VICODIN) 5-325 MG tablet Take 1 tablet by mouth every 6 (six) hours as needed for severe pain. 12/14/15   Felicie MornSmith, David, NP    Family History No family history on file.  Social History Social History   Tobacco Use  . Smoking status: Never Smoker  Substance Use Topics  . Alcohol use: No  . Drug use: No     Allergies   Patient has no known allergies.   Review of Systems Review of Systems  Constitutional: Negative for chills and fever.  All other systems reviewed and are negative.    Physical Exam Updated Vital Signs BP 115/72   Pulse 86   Temp (!) 97.5 F (36.4 C) (Oral)   Resp 18   SpO2 95%   Physical Exam  Constitutional: He is oriented to person, place, and time. He appears well-developed and well-nourished.  HENT:  Head: Normocephalic and atraumatic.    Cardiovascular: Intact distal pulses.  Right lower extremity 2+ DP/PT pulses.  Right foot is warm and well-perfused.  Musculoskeletal:  Diffuse pain and TTP over the rightknee, worse on the medial superior aspect.  No obvious abnormal erythema, edema.  Mild crepitus is felt over the kneecap with active range of motion.  Range of motion is mildly limited in flexion secondary to pain.  No pain over the right calf or right calf swelling.  5/5 ankle strength through dorsi and plantar flexion.  Neurological: He is alert and oriented to person, place, and time.  Sensation intact to right lower extremity.  Skin: Skin is warm and dry. He is not diaphoretic.  Psychiatric: He has a normal mood and affect.  Nursing note and vitals reviewed.    ED Treatments / Results  Labs (all labs ordered are listed, but only abnormal results are displayed) Labs Reviewed - No data to display  EKG None  Radiology Dg Knee Complete 4 Views Right  Result Date: 12/23/2017 CLINICAL DATA:  Right knee pain and swelling for 3 days. EXAM: RIGHT KNEE - COMPLETE 4+ VIEW COMPARISON:  None. FINDINGS: No evidence of fracture, dislocation, or joint effusion. Mild tricompartmental degenerative spurring is seen, without evidence of joint space narrowing. No other osseous abnormality identified. Soft tissues are unremarkable. IMPRESSION: No acute findings.  Mild  degenerative spurring. Electronically Signed   By: Myles Rosenthal M.D.   On: 12/23/2017 12:52    Procedures Procedures (including critical care time)  Medications Ordered in ED Medications - No data to display   Initial Impression / Assessment and Plan / ED Course  I have reviewed the triage vital signs and the nursing notes.  Pertinent labs & imaging results that were available during my care of the patient were reviewed by me and considered in my medical decision making (see chart for details).    Patient presents today for evaluation of 3 days of atraumatic knee  pain.  Pt unable to perform full flexion of the knee.  Pt is without systemic symptoms, erythema or redness of the joint consistent with gout or septic joint.  Patient X-Ray negative for obvious fracture or dislocation, he does have tricompartmental degenerative spurring which I suspect is causing his pain.  Pt advised to follow up with orthopedics if symptoms persist for further evaluation and treatment. Patient given brace while in ED, conservative therapy recommended and discussed. Patient will be dc home & is agreeable with above plan.    Final Clinical Impressions(s) / ED Diagnoses   Final diagnoses:  Acute pain of right knee    ED Discharge Orders    None       Norman Clay 12/23/17 1545    Gerhard Munch, MD 12/23/17 [phone]

## 2017-12-23 NOTE — Discharge Instructions (Addendum)
Is very important that you establish care with a primary care doctor.  I have given you a prescription for Mobic (meloxicam) today as you state you do not have any kidney problems.  Mobic is a NSAID medication and you should not take it with other NSAIDs.  Examples of other NSAIDS include motrin, ibuprofen, aleve, naproxen, and Voltaren.  Please monitor your bowel movements for dark, tarry, sticky stools. If you have any bowel movements like this you need to stop taking mobic and call your doctor as this may represent a stomach ulcer from taking NSAIDS.    Please take Tylenol (acetaminophen) to relieve your pain.  You may take tylenol, up to 1,000 mg (two extra strength pills).  Do not take more than 3,000 mg tylenol in a 24 hour period.  Please check all medication labels as many medications such as pain and cold medications may contain tylenol. Please do not drink alcohol while taking this medication.    While in the ED your blood pressure was high.  Please follow up with your primary care doctor or the wellness clinic for repeat evaluation as you may need medication.  High blood pressure can cause long term, potentially serious, damage if left untreated.

## 2017-12-23 NOTE — ED Notes (Signed)
Pt verbalized understanding of all d/c instructions, prescriptions, and f/u information. Opportunity for questioning and answers provided. VSS. All belongings with patient at this time. Pt ambulatory to lobby with steady gait. Pt aware of elevated BP and encouraged to f/u and establish PCP. Isaac ManisElizabeth GeorgiaPA aware of BP

## 2018-01-01 DIAGNOSIS — M25561 Pain in right knee: Secondary | ICD-10-CM | POA: Diagnosis not present

## 2018-01-05 DIAGNOSIS — M25561 Pain in right knee: Secondary | ICD-10-CM | POA: Diagnosis not present

## 2018-01-07 DIAGNOSIS — I10 Essential (primary) hypertension: Secondary | ICD-10-CM | POA: Diagnosis not present

## 2018-01-07 DIAGNOSIS — E119 Type 2 diabetes mellitus without complications: Secondary | ICD-10-CM | POA: Diagnosis not present

## 2018-01-07 DIAGNOSIS — R21 Rash and other nonspecific skin eruption: Secondary | ICD-10-CM | POA: Diagnosis not present

## 2018-01-08 DIAGNOSIS — M25561 Pain in right knee: Secondary | ICD-10-CM | POA: Diagnosis not present

## 2018-01-09 DIAGNOSIS — Z125 Encounter for screening for malignant neoplasm of prostate: Secondary | ICD-10-CM | POA: Diagnosis not present

## 2018-01-09 DIAGNOSIS — Z23 Encounter for immunization: Secondary | ICD-10-CM | POA: Diagnosis not present

## 2018-01-09 DIAGNOSIS — R7303 Prediabetes: Secondary | ICD-10-CM | POA: Diagnosis not present

## 2018-01-09 DIAGNOSIS — R600 Localized edema: Secondary | ICD-10-CM | POA: Diagnosis not present

## 2018-01-09 DIAGNOSIS — R590 Localized enlarged lymph nodes: Secondary | ICD-10-CM | POA: Diagnosis not present

## 2018-01-09 DIAGNOSIS — E785 Hyperlipidemia, unspecified: Secondary | ICD-10-CM | POA: Diagnosis not present

## 2018-01-09 DIAGNOSIS — I10 Essential (primary) hypertension: Secondary | ICD-10-CM | POA: Diagnosis not present

## 2018-01-09 DIAGNOSIS — M25561 Pain in right knee: Secondary | ICD-10-CM | POA: Diagnosis not present

## 2018-01-14 DIAGNOSIS — R590 Localized enlarged lymph nodes: Secondary | ICD-10-CM | POA: Diagnosis not present

## 2018-01-24 DIAGNOSIS — E785 Hyperlipidemia, unspecified: Secondary | ICD-10-CM | POA: Diagnosis not present

## 2018-01-24 DIAGNOSIS — E119 Type 2 diabetes mellitus without complications: Secondary | ICD-10-CM | POA: Diagnosis not present

## 2018-01-24 DIAGNOSIS — I872 Venous insufficiency (chronic) (peripheral): Secondary | ICD-10-CM | POA: Diagnosis not present

## 2018-01-24 DIAGNOSIS — I10 Essential (primary) hypertension: Secondary | ICD-10-CM | POA: Diagnosis not present

## 2018-02-18 DIAGNOSIS — R590 Localized enlarged lymph nodes: Secondary | ICD-10-CM | POA: Diagnosis not present

## 2018-02-21 DIAGNOSIS — E785 Hyperlipidemia, unspecified: Secondary | ICD-10-CM | POA: Diagnosis not present

## 2018-02-21 DIAGNOSIS — J4520 Mild intermittent asthma, uncomplicated: Secondary | ICD-10-CM | POA: Diagnosis not present

## 2018-02-21 DIAGNOSIS — M25561 Pain in right knee: Secondary | ICD-10-CM | POA: Diagnosis not present

## 2018-02-21 DIAGNOSIS — Z79899 Other long term (current) drug therapy: Secondary | ICD-10-CM | POA: Diagnosis not present

## 2018-02-21 DIAGNOSIS — E119 Type 2 diabetes mellitus without complications: Secondary | ICD-10-CM | POA: Diagnosis not present

## 2018-02-21 DIAGNOSIS — I10 Essential (primary) hypertension: Secondary | ICD-10-CM | POA: Diagnosis not present

## 2018-03-13 DIAGNOSIS — S83231A Complex tear of medial meniscus, current injury, right knee, initial encounter: Secondary | ICD-10-CM | POA: Diagnosis not present

## 2018-03-13 DIAGNOSIS — X58XXXA Exposure to other specified factors, initial encounter: Secondary | ICD-10-CM | POA: Diagnosis not present

## 2018-03-13 DIAGNOSIS — S83241A Other tear of medial meniscus, current injury, right knee, initial encounter: Secondary | ICD-10-CM | POA: Diagnosis not present

## 2018-03-13 DIAGNOSIS — G8918 Other acute postprocedural pain: Secondary | ICD-10-CM | POA: Diagnosis not present

## 2018-03-13 DIAGNOSIS — S83281A Other tear of lateral meniscus, current injury, right knee, initial encounter: Secondary | ICD-10-CM | POA: Diagnosis not present

## 2018-03-13 DIAGNOSIS — M1711 Unilateral primary osteoarthritis, right knee: Secondary | ICD-10-CM | POA: Diagnosis not present

## 2018-03-13 DIAGNOSIS — Y999 Unspecified external cause status: Secondary | ICD-10-CM | POA: Diagnosis not present

## 2018-03-18 DIAGNOSIS — M1711 Unilateral primary osteoarthritis, right knee: Secondary | ICD-10-CM | POA: Diagnosis not present

## 2018-03-18 DIAGNOSIS — S83241D Other tear of medial meniscus, current injury, right knee, subsequent encounter: Secondary | ICD-10-CM | POA: Diagnosis not present

## 2018-03-18 DIAGNOSIS — M25561 Pain in right knee: Secondary | ICD-10-CM | POA: Diagnosis not present

## 2018-03-18 DIAGNOSIS — S83281D Other tear of lateral meniscus, current injury, right knee, subsequent encounter: Secondary | ICD-10-CM | POA: Diagnosis not present

## 2018-03-21 DIAGNOSIS — S83241D Other tear of medial meniscus, current injury, right knee, subsequent encounter: Secondary | ICD-10-CM | POA: Diagnosis not present

## 2018-03-21 DIAGNOSIS — S83281D Other tear of lateral meniscus, current injury, right knee, subsequent encounter: Secondary | ICD-10-CM | POA: Diagnosis not present

## 2018-03-25 DIAGNOSIS — S83241D Other tear of medial meniscus, current injury, right knee, subsequent encounter: Secondary | ICD-10-CM | POA: Diagnosis not present

## 2018-03-25 DIAGNOSIS — M1711 Unilateral primary osteoarthritis, right knee: Secondary | ICD-10-CM | POA: Diagnosis not present

## 2018-03-25 DIAGNOSIS — M25561 Pain in right knee: Secondary | ICD-10-CM | POA: Diagnosis not present

## 2018-03-25 DIAGNOSIS — S83281D Other tear of lateral meniscus, current injury, right knee, subsequent encounter: Secondary | ICD-10-CM | POA: Diagnosis not present

## 2018-03-29 DIAGNOSIS — M25561 Pain in right knee: Secondary | ICD-10-CM | POA: Diagnosis not present

## 2018-03-29 DIAGNOSIS — S83281D Other tear of lateral meniscus, current injury, right knee, subsequent encounter: Secondary | ICD-10-CM | POA: Diagnosis not present

## 2018-03-29 DIAGNOSIS — M1711 Unilateral primary osteoarthritis, right knee: Secondary | ICD-10-CM | POA: Diagnosis not present

## 2018-03-29 DIAGNOSIS — S83241D Other tear of medial meniscus, current injury, right knee, subsequent encounter: Secondary | ICD-10-CM | POA: Diagnosis not present

## 2018-04-01 DIAGNOSIS — M23221 Derangement of posterior horn of medial meniscus due to old tear or injury, right knee: Secondary | ICD-10-CM | POA: Diagnosis not present

## 2018-04-01 DIAGNOSIS — M25561 Pain in right knee: Secondary | ICD-10-CM | POA: Diagnosis not present

## 2018-04-01 DIAGNOSIS — M1711 Unilateral primary osteoarthritis, right knee: Secondary | ICD-10-CM | POA: Diagnosis not present

## 2018-04-01 DIAGNOSIS — S83281D Other tear of lateral meniscus, current injury, right knee, subsequent encounter: Secondary | ICD-10-CM | POA: Diagnosis not present

## 2018-04-18 DIAGNOSIS — M1711 Unilateral primary osteoarthritis, right knee: Secondary | ICD-10-CM | POA: Diagnosis not present

## 2018-04-26 DIAGNOSIS — R809 Proteinuria, unspecified: Secondary | ICD-10-CM | POA: Diagnosis not present

## 2018-04-26 DIAGNOSIS — E785 Hyperlipidemia, unspecified: Secondary | ICD-10-CM | POA: Diagnosis not present

## 2018-04-26 DIAGNOSIS — J4520 Mild intermittent asthma, uncomplicated: Secondary | ICD-10-CM | POA: Diagnosis not present

## 2018-04-26 DIAGNOSIS — E1129 Type 2 diabetes mellitus with other diabetic kidney complication: Secondary | ICD-10-CM | POA: Diagnosis not present

## 2018-11-07 ENCOUNTER — Ambulatory Visit (HOSPITAL_COMMUNITY)
Admission: EM | Admit: 2018-11-07 | Discharge: 2018-11-07 | Disposition: A | Discharge status: Home / Self Care | Payer: BLUE CROSS/BLUE SHIELD | Attending: Family Medicine | Admitting: Family Medicine

## 2018-11-07 ENCOUNTER — Encounter (HOSPITAL_COMMUNITY): Payer: Self-pay

## 2018-11-07 DIAGNOSIS — R600 Localized edema: Secondary | ICD-10-CM | POA: Diagnosis not present

## 2018-11-07 DIAGNOSIS — E1165 Type 2 diabetes mellitus with hyperglycemia: Secondary | ICD-10-CM

## 2018-11-07 DIAGNOSIS — E1169 Type 2 diabetes mellitus with other specified complication: Secondary | ICD-10-CM | POA: Diagnosis not present

## 2018-11-07 DIAGNOSIS — I872 Venous insufficiency (chronic) (peripheral): Secondary | ICD-10-CM

## 2018-11-07 LAB — GLUCOSE, CAPILLARY: GLUCOSE-CAPILLARY: 157 mg/dL — AB (ref 70–99)

## 2018-11-07 LAB — BASIC METABOLIC PANEL
ANION GAP: 9 (ref 5–15)
BUN: 12 mg/dL (ref 6–20)
CALCIUM: 8.6 mg/dL — AB (ref 8.9–10.3)
CHLORIDE: 104 mmol/L (ref 98–111)
CO2: 25 mmol/L (ref 22–32)
CREATININE: 1.27 mg/dL — AB (ref 0.61–1.24)
GFR calc Af Amer: >60 mL/min (ref >60)
GFR calc non Af Amer: >60 mL/min (ref >60)
Glucose, Bld: 149 mg/dL — ABNORMAL HIGH (ref 70–99)
POTASSIUM: 3.7 mmol/L (ref 3.5–5.1)
SODIUM: 138 mmol/L (ref 135–145)

## 2018-11-07 MED ORDER — FUROSEMIDE 20 MG PO TABS: 20.0000 mg | ORAL_TABLET | Freq: Two times a day (BID) | ORAL | 0 refills | Status: AC

## 2018-11-07 MED ORDER — TRIAMCINOLONE ACETONIDE 0.1 % EX OINT: 1.0000 "application " | TOPICAL_OINTMENT | Freq: Two times a day (BID) | CUTANEOUS | 0 refills | Status: DC

## 2018-11-07 NOTE — Discharge Instructions (Signed)
Take Lasix in the morning.  Take a second dose early afternoon This will increase your urination, to get rid of extra fluid Avoid salt Twice a day you need to wash the legs, apply triamcinolone ointment, and rewrap Must be off your feet for the next 2 to 3 days with feet elevated above level of heart Call your doctor today to get an appointment for Monday or Tuesday of next week Return here or to ER if worse instead of better at any time

## 2018-11-07 NOTE — ED Triage Notes (Addendum)
Pt present rash on both of his legs,and arms.   pt notice the rash few days ago.  Pt states the rash is Itching,  burning and some drainage.

## 2018-11-07 NOTE — ED Provider Notes (Signed)
[facility]    CSN: [id number] Arrival date & time: 11/07/18  4098     History   Chief Complaint Chief Complaint  Patient presents with  . Rash    Lowe legs     HPI Isaac [name] is a 58 y.o. male.   HPI  Patient is here for weeping rash on both legs.  Left greater than right.  Legs are swollen.  Uncomfortable but not painful.  No fever or chills.  There is fluid leaking from both legs. He is a noncompliant diabetic who has not been taking his medicine or going to his physician for some time.  He has some urinary frequency.  Denies thirst, dizziness, blurred vision He also has a rash diffusely on his body that itches.  He scratches at this frequently.  This is chronic.  Past Medical History:  Diagnosis Date  . Diabetes mellitus without complication (HCC)     There are no active problems to display for this patient.   History reviewed. No pertinent surgical history.     Home Medications    Prior to Admission medications   Medication Sig Start Date End Date Taking? Authorizing Provider  furosemide (LASIX) 20 MG tablet Take 1 tablet (20 mg total) by mouth 2 (two) times daily. 11/07/18   Eustace Moore, MD  triamcinolone ointment (KENALOG) 0.1 % Apply 1 application topically 2 (two) times daily. 11/07/18   Eustace Moore, MD    Family History History reviewed. No pertinent family history.  Social History Social History   Tobacco Use  . Smoking status: Never Smoker  Substance Use Topics  . Alcohol use: No  . Drug use: No     Allergies   Patient has no known allergies.   Review of Systems Review of Systems  Constitutional: Negative for chills and fever.  HENT: Negative for ear pain and sore throat.   Eyes: Negative for pain and visual disturbance.  Respiratory: Negative for cough and shortness of breath.   Cardiovascular: Positive for leg swelling. Negative for chest pain and palpitations.  Gastrointestinal: Negative for abdominal pain  and vomiting.  Endocrine: Positive for polyuria. Negative for polydipsia and polyphagia.  Genitourinary: Negative for dysuria and hematuria.  Musculoskeletal: Negative for arthralgias and back pain.  Skin: Positive for rash and wound. Negative for color change.  Neurological: Negative for seizures and syncope.  All other systems reviewed and are negative.    Physical Exam Triage Vital Signs ED Triage Vitals  Enc Vitals Group     BP 11/07/18 0831 (!) 157/101     Pulse Rate 11/07/18 0831 78     Resp 11/07/18 0831 18     Temp 11/07/18 0831 97.9 F (36.6 C)     Temp Source 11/07/18 0831 Oral     SpO2 11/07/18 0831 100 %   No data found.  Updated Vital Signs BP (!) 157/96 (BP Location: Right Arm)   Pulse 80   Temp 97.9 F (36.6 C) (Oral)   Resp 16   SpO2 100%      Physical Exam Constitutional:      General: He is not in acute distress.    Appearance: He is obese.  HENT:     Head: Normocephalic and atraumatic.     Nose: Nose normal. No rhinorrhea.     Mouth/Throat:     Mouth: Mucous membranes are moist.  Eyes:     Conjunctiva/sclera: Conjunctivae normal.     Pupils: Pupils are equal, round,  and reactive to light.  Neck:     Musculoskeletal: Normal range of motion.     Vascular: No carotid bruit.  Cardiovascular:     Rate and Rhythm: Normal rate and regular rhythm.     Heart sounds: Normal heart sounds.  Pulmonary:     Effort: Pulmonary effort is normal.     Breath sounds: Normal breath sounds. No rales.  Abdominal:     Comments: Obese abdomen, soft and nontender  Musculoskeletal:        General: Swelling present.     Right lower leg: Edema present.     Left lower leg: Edema present.  Lymphadenopathy:     Cervical: No cervical adenopathy.  Skin:    Findings: Rash present.     Comments: Patient has a papular diffuse rash on trunk and extremities with hyperkeratosis and hyperpigmentation, possibly diffuse eczematous. Both lower extremities have edema.  Left is  greater than right.  Chronic venous stasis changes with hyperpigmentation.  Weeping serous fluid.  Hyperkeratosis of left shin with large area of thickened leathery hyperpigmented coating  Neurological:     General: No focal deficit present.     Mental Status: He is alert and oriented to person, place, and time.  Psychiatric:        Mood and Affect: Mood normal.        Behavior: Behavior normal.      UC Treatments / Results  Labs (all labs ordered are listed, but only abnormal results are displayed) Labs Reviewed  BASIC METABOLIC PANEL - Abnormal; Notable for the following components:      Result Value   Glucose, Bld 149 (*)    Creatinine, Ser 1.27 (*)    Calcium 8.6 (*)    All other components within normal limits  GLUCOSE, CAPILLARY - Abnormal; Notable for the following components:   Glucose-Capillary 157 (*)    All other components within normal limits  CBG MONITORING, ED    EKG None  Radiology No results found.  Procedures Procedures (including critical care time)  Medications Ordered in UC Medications - No data to display  Initial Impression / Assessment and Plan / UC Course  I have reviewed the triage vital signs and the nursing notes.  Pertinent labs & imaging results that were available during my care of the patient were reviewed by me and considered in my medical decision making (see chart for details).    Need to manage the edema with lasix and leg elevation.  Skin care discussed.  Follow up with PCP in a few days is essential.  The BMP was favorable for starting a few days of lasix ( #10 ).  No evidence cellulitisl  Final Clinical Impressions(s) / UC Diagnoses   Final diagnoses:  Venous stasis dermatitis of both lower extremities  Pedal edema  Type 2 diabetes mellitus with other specified complication, without long-term current use of insulin (HCC)     Discharge Instructions     Take Lasix in the morning.  Take a second dose early afternoon This  will increase your urination, to get rid of extra fluid Avoid salt Twice a day you need to wash the legs, apply triamcinolone ointment, and rewrap Must be off your feet for the next 2 to 3 days with feet elevated above level of heart Call your doctor today to get an appointment for Monday or Tuesday of next week Return here or to ER if worse instead of better at any time  ED Prescriptions    Medication Sig Dispense Auth. Provider   furosemide (LASIX) 20 MG tablet Take 1 tablet (20 mg total) by mouth 2 (two) times daily. 10 tablet Eustace Moore, MD   triamcinolone ointment (KENALOG) 0.1 % Apply 1 application topically 2 (two) times daily. 80 g Eustace Moore, MD     Controlled Substance Prescriptions [place] Controlled Substance Registry consulted? Not Applicable   Eustace Moore, MD 11/07/18 1109

## 2018-11-11 DIAGNOSIS — E1129 Type 2 diabetes mellitus with other diabetic kidney complication: Secondary | ICD-10-CM | POA: Diagnosis not present

## 2018-11-11 DIAGNOSIS — R809 Proteinuria, unspecified: Secondary | ICD-10-CM | POA: Diagnosis not present

## 2018-11-11 DIAGNOSIS — E785 Hyperlipidemia, unspecified: Secondary | ICD-10-CM | POA: Diagnosis not present

## 2018-11-11 DIAGNOSIS — I10 Essential (primary) hypertension: Secondary | ICD-10-CM | POA: Diagnosis not present

## 2018-11-11 DIAGNOSIS — Z79899 Other long term (current) drug therapy: Secondary | ICD-10-CM | POA: Diagnosis not present

## 2018-12-09 DIAGNOSIS — I10 Essential (primary) hypertension: Secondary | ICD-10-CM | POA: Diagnosis not present

## 2018-12-09 DIAGNOSIS — E785 Hyperlipidemia, unspecified: Secondary | ICD-10-CM | POA: Diagnosis not present

## 2018-12-09 DIAGNOSIS — I872 Venous insufficiency (chronic) (peripheral): Secondary | ICD-10-CM | POA: Diagnosis not present

## 2019-04-29 DIAGNOSIS — E1129 Type 2 diabetes mellitus with other diabetic kidney complication: Secondary | ICD-10-CM | POA: Diagnosis not present

## 2019-04-29 DIAGNOSIS — R809 Proteinuria, unspecified: Secondary | ICD-10-CM | POA: Diagnosis not present

## 2019-04-29 DIAGNOSIS — E785 Hyperlipidemia, unspecified: Secondary | ICD-10-CM | POA: Diagnosis not present

## 2019-05-02 DIAGNOSIS — R809 Proteinuria, unspecified: Secondary | ICD-10-CM | POA: Diagnosis not present

## 2019-05-02 DIAGNOSIS — I10 Essential (primary) hypertension: Secondary | ICD-10-CM | POA: Diagnosis not present

## 2019-05-02 DIAGNOSIS — E785 Hyperlipidemia, unspecified: Secondary | ICD-10-CM | POA: Diagnosis not present

## 2019-05-02 DIAGNOSIS — E1129 Type 2 diabetes mellitus with other diabetic kidney complication: Secondary | ICD-10-CM | POA: Diagnosis not present

## 2019-06-23 DIAGNOSIS — E785 Hyperlipidemia, unspecified: Secondary | ICD-10-CM | POA: Diagnosis not present

## 2019-06-26 DIAGNOSIS — E785 Hyperlipidemia, unspecified: Secondary | ICD-10-CM | POA: Diagnosis not present

## 2019-06-26 DIAGNOSIS — I10 Essential (primary) hypertension: Secondary | ICD-10-CM | POA: Diagnosis not present

## 2019-10-23 DIAGNOSIS — E785 Hyperlipidemia, unspecified: Secondary | ICD-10-CM | POA: Diagnosis not present

## 2019-10-23 DIAGNOSIS — R809 Proteinuria, unspecified: Secondary | ICD-10-CM | POA: Diagnosis not present

## 2019-10-23 DIAGNOSIS — E1129 Type 2 diabetes mellitus with other diabetic kidney complication: Secondary | ICD-10-CM | POA: Diagnosis not present

## 2019-10-28 DIAGNOSIS — I10 Essential (primary) hypertension: Secondary | ICD-10-CM | POA: Diagnosis not present

## 2019-10-28 DIAGNOSIS — E1129 Type 2 diabetes mellitus with other diabetic kidney complication: Secondary | ICD-10-CM | POA: Diagnosis not present

## 2019-10-28 DIAGNOSIS — J4520 Mild intermittent asthma, uncomplicated: Secondary | ICD-10-CM | POA: Diagnosis not present

## 2019-10-28 DIAGNOSIS — E785 Hyperlipidemia, unspecified: Secondary | ICD-10-CM | POA: Diagnosis not present

## 2020-01-09 DIAGNOSIS — I872 Venous insufficiency (chronic) (peripheral): Secondary | ICD-10-CM | POA: Diagnosis not present

## 2020-01-09 DIAGNOSIS — I10 Essential (primary) hypertension: Secondary | ICD-10-CM | POA: Diagnosis not present

## 2020-02-15 IMAGING — CR DG KNEE COMPLETE 4+V*R*
4 series · 4 of 4 positions shown · non-contrast
Comparison: None.

CLINICAL DATA: Right knee pain and swelling for 3 days.

EXAM:
RIGHT KNEE - COMPLETE 4+ VIEW

[knee ap]
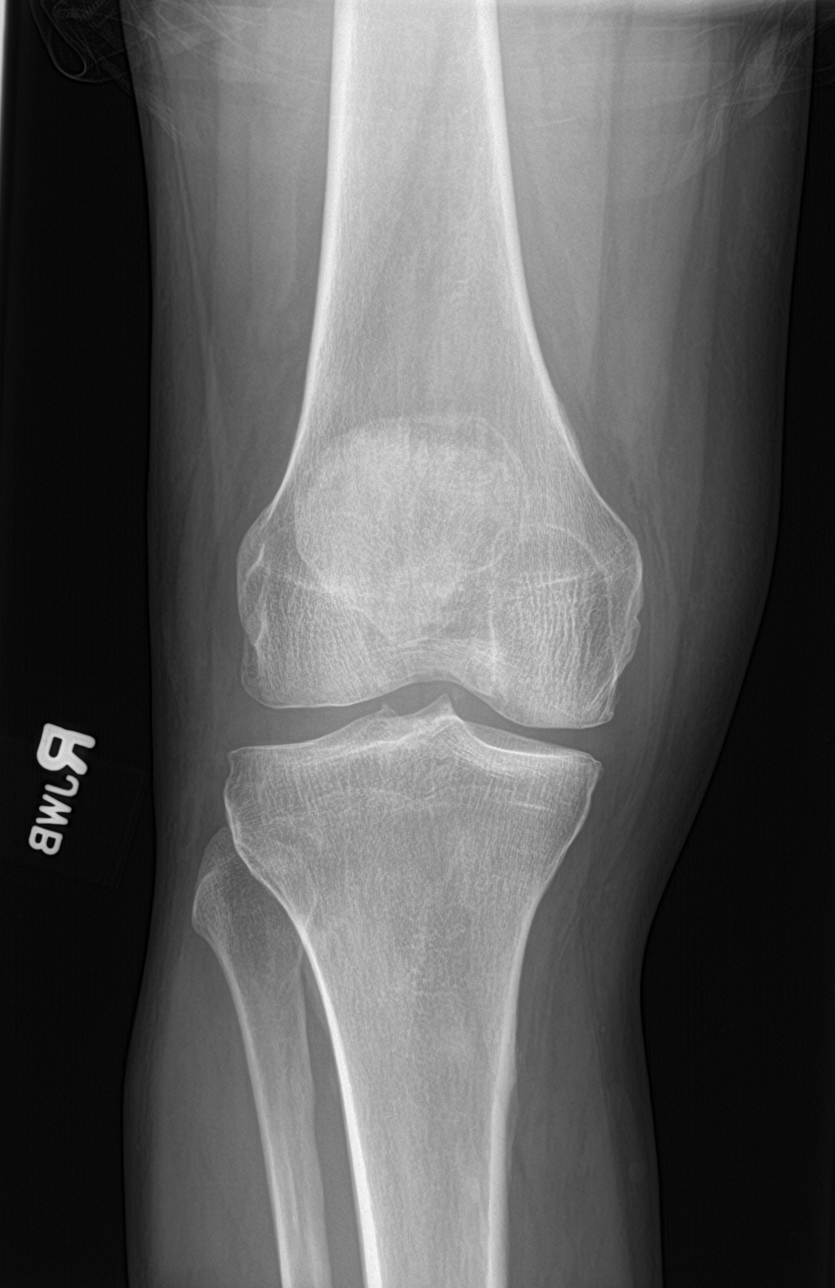

[knee lat]
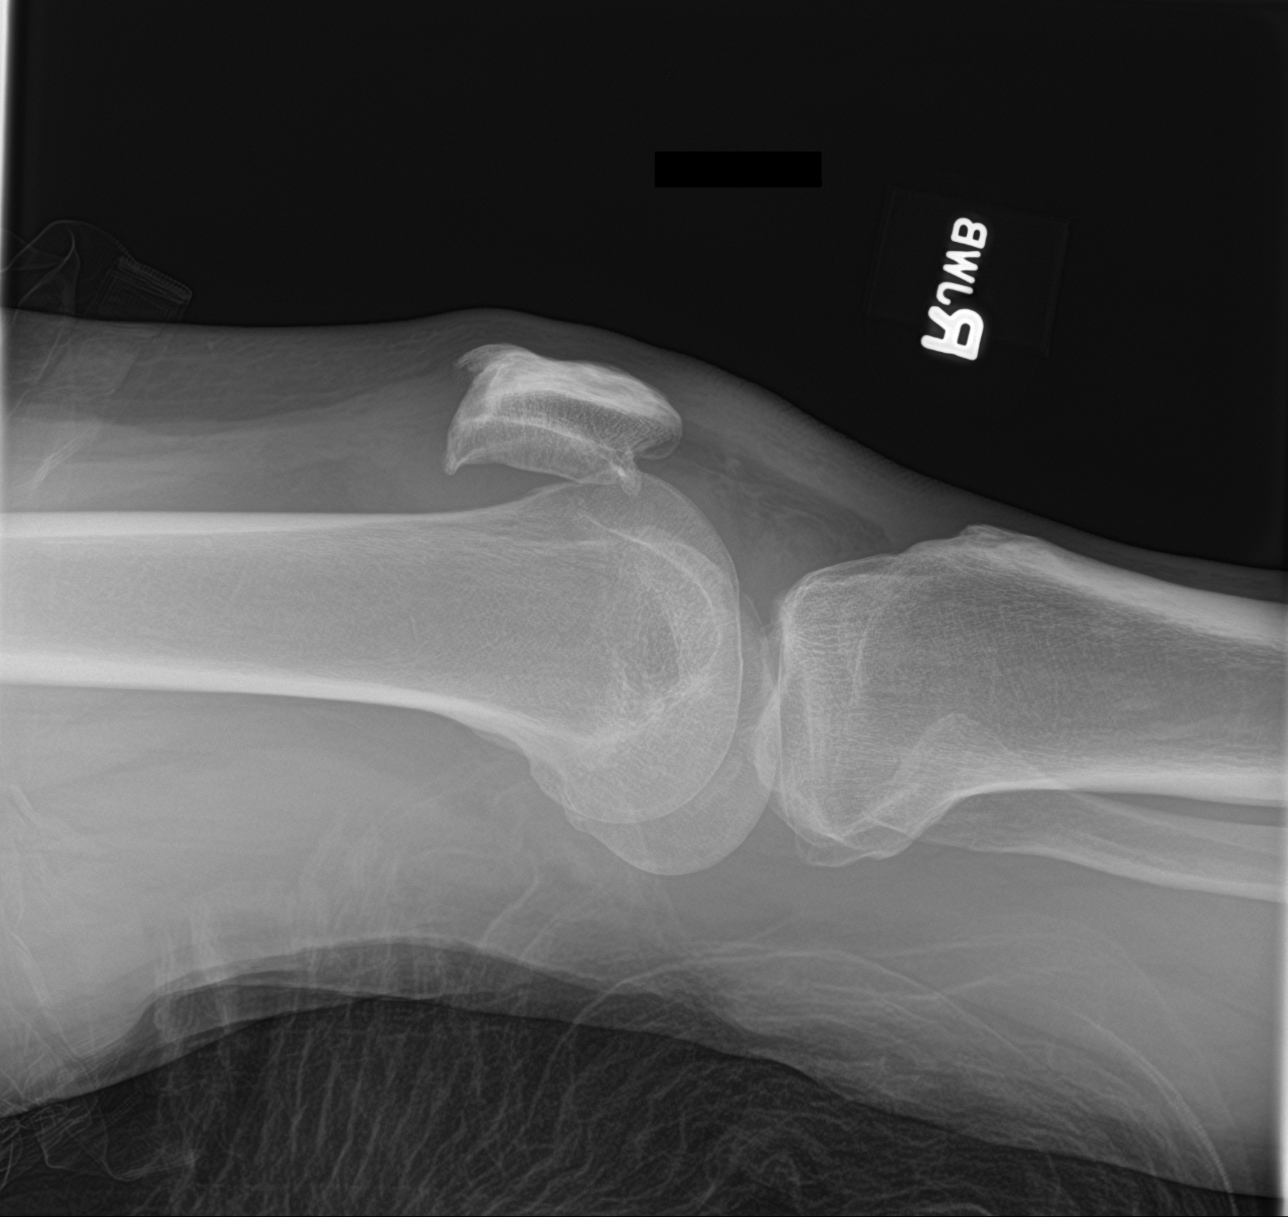

[knee obl (1 of 2)]
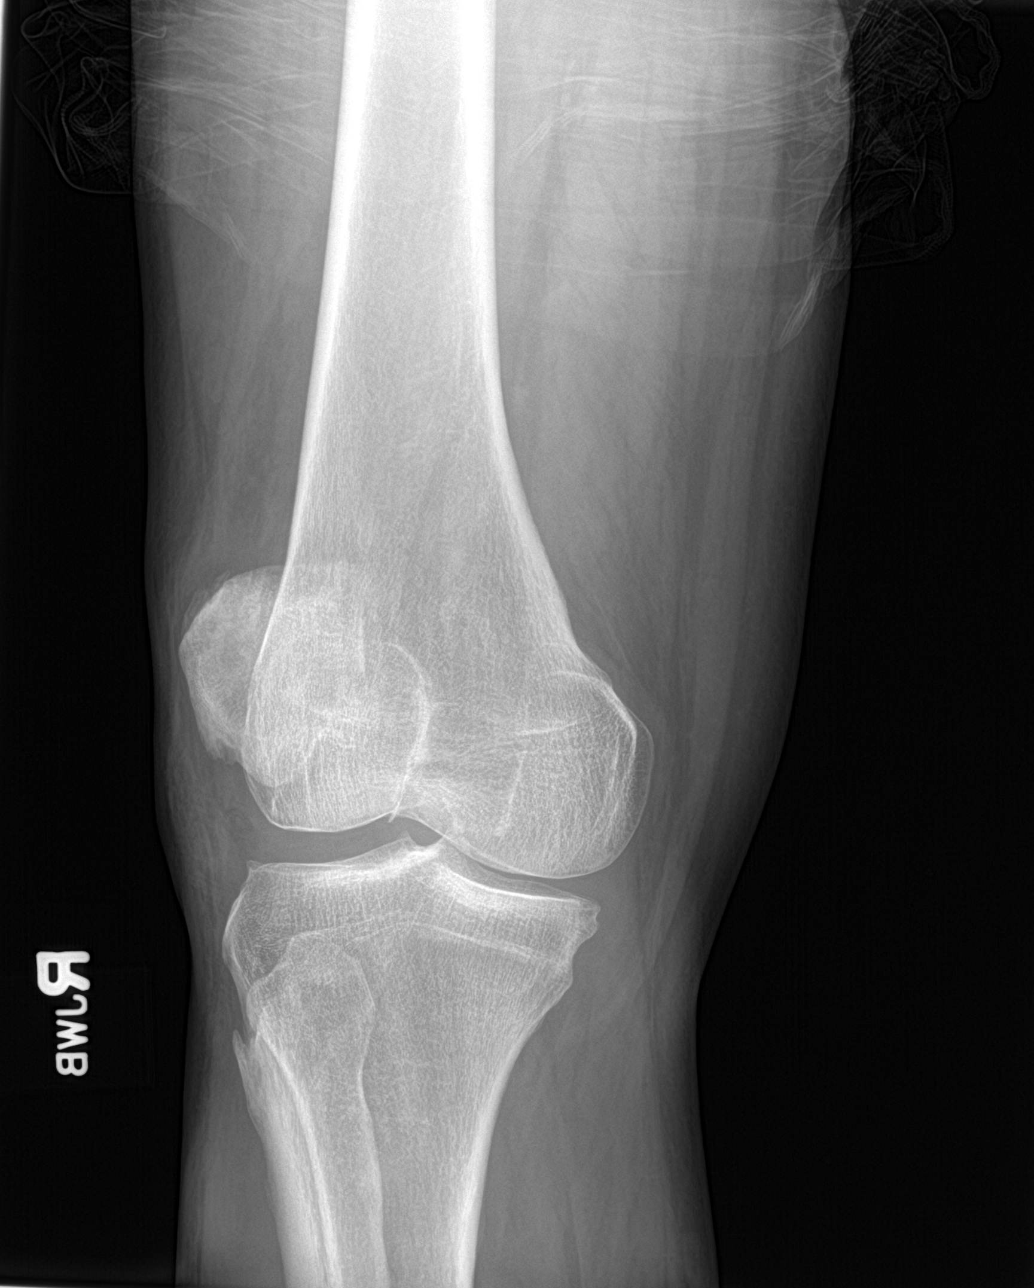

[knee obl (2 of 2)]
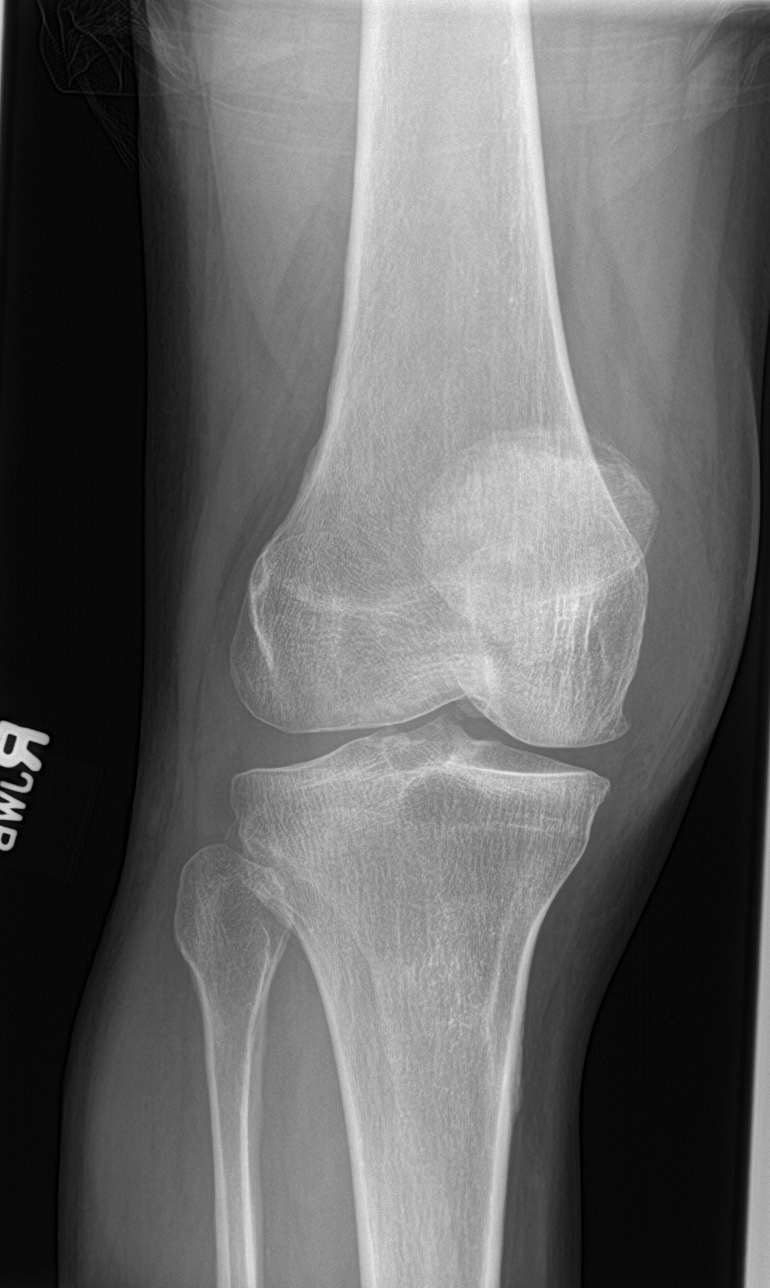

[4 of 4 positions shown; findings below may reference images not displayed]

FINDINGS: No evidence of fracture, dislocation, or joint effusion. Mild
tricompartmental degenerative spurring is seen, without evidence of
joint space narrowing. No other osseous abnormality identified. Soft
tissues are unremarkable.
IMPRESSION: No acute findings.  Mild degenerative spurring.

## 2020-05-10 ENCOUNTER — Emergency Department (HOSPITAL_COMMUNITY)
Admission: EM | Admit: 2020-05-10 | Discharge: 2020-05-11 | Disposition: A | Discharge status: Home / Self Care | Payer: BC Managed Care – PPO | Attending: Emergency Medicine | Admitting: Emergency Medicine

## 2020-05-10 ENCOUNTER — Other Ambulatory Visit: Payer: Self-pay

## 2020-05-10 ENCOUNTER — Encounter (HOSPITAL_COMMUNITY): Payer: Self-pay | Admitting: *Deleted

## 2020-05-10 DIAGNOSIS — R739 Hyperglycemia, unspecified: Secondary | ICD-10-CM

## 2020-05-10 DIAGNOSIS — Z7984 Long term (current) use of oral hypoglycemic drugs: Secondary | ICD-10-CM | POA: Insufficient documentation

## 2020-05-10 DIAGNOSIS — E1165 Type 2 diabetes mellitus with hyperglycemia: Secondary | ICD-10-CM | POA: Insufficient documentation

## 2020-05-10 DIAGNOSIS — Z79899 Other long term (current) drug therapy: Secondary | ICD-10-CM | POA: Insufficient documentation

## 2020-05-10 LAB — CBG MONITORING, ED: Glucose-Capillary: >600 mg/dL (ref 70–99)

## 2020-05-10 LAB — URINALYSIS, ROUTINE W REFLEX MICROSCOPIC
Bacteria, UA: NONE SEEN
Bilirubin Urine: NEGATIVE
Glucose, UA: >=500 mg/dL — AB
Hgb urine dipstick: NEGATIVE
Ketones, ur: NEGATIVE mg/dL
Leukocytes,Ua: NEGATIVE
Nitrite: NEGATIVE
Protein, ur: NEGATIVE mg/dL
Specific Gravity, Urine: 1.024 (ref 1.005–1.030)
pH: 6 (ref 5.0–8.0)

## 2020-05-10 LAB — CBC
HCT: 46.4 % (ref 39.0–52.0)
Hemoglobin: 16.1 g/dL (ref 13.0–17.0)
MCH: 30.4 pg (ref 26.0–34.0)
MCHC: 34.7 g/dL (ref 30.0–36.0)
MCV: 87.5 fL (ref 80.0–100.0)
Platelets: 297 10*3/uL (ref 150–400)
RBC: 5.3 MIL/uL (ref 4.22–5.81)
RDW: 11.6 % (ref 11.5–15.5)
WBC: 8.4 10*3/uL (ref 4.0–10.5)
nRBC: 0 % (ref 0.0–0.2)

## 2020-05-10 LAB — BASIC METABOLIC PANEL
Anion gap: 13 (ref 5–15)
BUN: 18 mg/dL (ref 6–20)
CO2: 20 mmol/L — ABNORMAL LOW (ref 22–32)
Calcium: 9.2 mg/dL (ref 8.9–10.3)
Chloride: 89 mmol/L — ABNORMAL LOW (ref 98–111)
Creatinine, Ser: 1.61 mg/dL — ABNORMAL HIGH (ref 0.61–1.24)
GFR calc Af Amer: 54 mL/min — ABNORMAL LOW (ref >60)
GFR calc non Af Amer: 47 mL/min — ABNORMAL LOW (ref >60)
Glucose, Bld: 836 mg/dL (ref 70–99)
Potassium: 5.3 mmol/L — ABNORMAL HIGH (ref 3.5–5.1)
Sodium: 122 mmol/L — ABNORMAL LOW (ref 135–145)

## 2020-05-10 NOTE — ED Triage Notes (Signed)
Pt reports feeling bad for several days, had ems called to work tonight and cbg was high. Reports hx of diabetes but usually controlled with diet.

## 2020-05-11 LAB — CBG MONITORING, ED
Glucose-Capillary: 218 mg/dL — ABNORMAL HIGH (ref 70–99)
Glucose-Capillary: 307 mg/dL — ABNORMAL HIGH (ref 70–99)
Glucose-Capillary: 455 mg/dL — ABNORMAL HIGH (ref 70–99)
Glucose-Capillary: >600 mg/dL (ref 70–99)

## 2020-05-11 MED ORDER — METFORMIN HCL 500 MG PO TABS
500.0000 mg | ORAL_TABLET | Freq: Once | ORAL | Status: AC
  Administered 2020-05-11: 500 mg via ORAL
  Filled 2020-05-11: qty 1

## 2020-05-11 MED ORDER — DEXTROSE 50 % IV SOLN: 0.0000 mL | INTRAVENOUS | Status: DC | PRN

## 2020-05-11 MED ORDER — LACTATED RINGERS IV SOLN: INTRAVENOUS | Status: DC

## 2020-05-11 MED ORDER — INSULIN REGULAR(HUMAN) IN NACL 100-0.9 UT/100ML-% IV SOLN
INTRAVENOUS | Status: DC
  Administered 2020-05-11: 13 [IU]/h via INTRAVENOUS
  Filled 2020-05-11: qty 100

## 2020-05-11 MED ORDER — DEXTROSE IN LACTATED RINGERS 5 % IV SOLN: INTRAVENOUS | Status: DC

## 2020-05-11 MED ORDER — METFORMIN HCL 500 MG PO TABS: 500.0000 mg | ORAL_TABLET | Freq: Two times a day (BID) | ORAL | 0 refills | Status: AC

## 2020-05-11 MED ORDER — LACTATED RINGERS IV BOLUS
20.0000 mL/kg | Freq: Once | INTRAVENOUS | Status: AC
  Administered 2020-05-11: 1000 mL via INTRAVENOUS

## 2020-05-11 NOTE — ED Notes (Signed)
Abayomi Pattison sister [phone] would like an update on the pt

## 2020-05-11 NOTE — ED Provider Notes (Signed)
[facility] EMERGENCY DEPARTMENT Provider Note   CSN: [id number] Arrival date & time: 05/10/20  2248     History Chief Complaint  Patient presents with  . Hyperglycemia    Isaac [name] is a 59 y.o. male.  Patient presents to the emergency department for elevated blood sugar.  Patient reports that he has not been feeling well for the last few days.  He has had some fatigue, and complains of nausea without vomiting.  He was at work tonight and had his blood sugar checked which was significantly elevated.  He does have a history of diabetes.  He reports that he was previously on insulin but was taken off of medications and now is diet controlled.        Past Medical History:  Diagnosis Date  . Diabetes mellitus without complication (HCC)     There are no problems to display for this patient.   History reviewed. No pertinent surgical history.     History reviewed. No pertinent family history.  Social History   Tobacco Use  . Smoking status: Never Smoker  Substance Use Topics  . Alcohol use: No  . Drug use: No    Home Medications Prior to Admission medications   Medication Sig Start Date End Date Taking? Authorizing Provider  albuterol (VENTOLIN HFA) 108 (90 Base) MCG/ACT inhaler Inhale 2 puffs into the lungs every 6 (six) hours as needed for wheezing or shortness of breath.  02/28/20  Yes [provider]  furosemide (LASIX) 20 MG tablet Take 1 tablet (20 mg total) by mouth 2 (two) times daily. Patient taking differently: Take 20 mg by mouth daily as needed for fluid.  11/07/18  Yes Eustace Moore, MD  ibuprofen (ADVIL) 200 MG tablet Take 400 mg by mouth every 8 (eight) hours as needed for moderate pain.   Yes [provider]  metoprolol succinate (TOPROL-XL) 100 MG 24 hr tablet Take 100 mg by mouth daily. 03/30/20  Yes [provider]  metFORMIN (GLUCOPHAGE) 500 MG tablet Take 1 tablet (500 mg total) by mouth 2 (two)  times daily with a meal. 05/11/20   Malek Skog, Canary Brim, MD    Allergies    Patient has no known allergies.  Review of Systems   Review of Systems  Constitutional: Positive for fatigue.  Gastrointestinal: Positive for nausea.  All other systems reviewed and are negative.   Physical Exam Updated Vital Signs BP (!) 141/119   Pulse (!) 111   Temp 98.5 F (36.9 C) (Oral)   Resp 16   Ht 5\' 7"  (1.702 m)   Wt 90.7 kg   SpO2 96%   BMI 31.32 kg/m   Physical Exam Vitals and nursing note reviewed.  Constitutional:      General: He is not in acute distress.    Appearance: Normal appearance. He is well-developed.  HENT:     Head: Normocephalic and atraumatic.     Right Ear: Hearing normal.     Left Ear: Hearing normal.     Nose: Nose normal.  Eyes:     Conjunctiva/sclera: Conjunctivae normal.     Pupils: Pupils are equal, round, and reactive to light.  Cardiovascular:     Rate and Rhythm: Regular rhythm.     Heart sounds: S1 normal and S2 normal. No murmur heard.  No friction rub. No gallop.   Pulmonary:     Effort: Pulmonary effort is normal. No respiratory distress.     Breath sounds: Normal breath  sounds.  Chest:     Chest wall: No tenderness.  Abdominal:     General: Bowel sounds are normal.     Palpations: Abdomen is soft.     Tenderness: There is no abdominal tenderness. There is no guarding or rebound. Negative signs include Murphy's sign and McBurney's sign.     Hernia: No hernia is present.  Musculoskeletal:        General: Normal range of motion.     Cervical back: Normal range of motion and neck supple.  Skin:    General: Skin is warm and dry.     Findings: No rash.  Neurological:     Mental Status: He is alert and oriented to person, place, and time.     GCS: GCS eye subscore is 4. GCS verbal subscore is 5. GCS motor subscore is 6.     Cranial Nerves: No cranial nerve deficit.     Sensory: No sensory deficit.     Coordination: Coordination normal.    Psychiatric:        Speech: Speech normal.        Behavior: Behavior normal.        Thought Content: Thought content normal.     ED Results / Procedures / Treatments   Labs (all labs ordered are listed, but only abnormal results are displayed) Labs Reviewed  BASIC METABOLIC PANEL - Abnormal; Notable for the following components:      Result Value   Sodium 122 (*)    Potassium 5.3 (*)    Chloride 89 (*)    CO2 20 (*)    Glucose, Bld 836 (*)    Creatinine, Ser 1.61 (*)    GFR calc non Af Amer 47 (*)    GFR calc Af Amer 54 (*)    All other components within normal limits  URINALYSIS, ROUTINE W REFLEX MICROSCOPIC - Abnormal; Notable for the following components:   Color, Urine STRAW (*)    Glucose, UA >=500 (*)    All other components within normal limits  CBG MONITORING, ED - Abnormal; Notable for the following components:   Glucose-Capillary >600 (*)    All other components within normal limits  CBG MONITORING, ED - Abnormal; Notable for the following components:   Glucose-Capillary >600 (*)    All other components within normal limits  CBG MONITORING, ED - Abnormal; Notable for the following components:   Glucose-Capillary 455 (*)    All other components within normal limits  CBG MONITORING, ED - Abnormal; Notable for the following components:   Glucose-Capillary 307 (*)    All other components within normal limits  CBG MONITORING, ED - Abnormal; Notable for the following components:   Glucose-Capillary 218 (*)    All other components within normal limits  CBC    EKG None  Radiology No results found.  Procedures Procedures (including critical care time)  Medications Ordered in ED Medications  insulin regular, human (MYXREDLIN) 100 units/ 100 mL infusion (4.2 Units/hr Intravenous Rate/Dose Change 05/11/20 0508)  lactated ringers infusion ( Intravenous New Bag/Given 05/11/20 0509)  dextrose 5 % in lactated ringers infusion ( Intravenous New Bag/Given 05/11/20  0512)  dextrose 50 % solution 0-50 mL (has no administration in time range)  metFORMIN (GLUCOPHAGE) tablet 500 mg (has no administration in time range)  lactated ringers bolus 1,814 mL (0 mL/kg  90.7 kg Intravenous Stopped 05/11/20 0243)    ED Course  I have reviewed the triage vital signs and the nursing notes.  Pertinent labs & imaging results that were available during my care of the patient were reviewed by me and considered in my medical decision making (see chart for details).    MDM Rules/Calculators/A&P                          Patient presents to the emergency department for evaluation of elevated blood sugar.  Patient is a known diabetic, previously insulin-dependent.  He reports that he has now diet controlled.  He has been feeling poorly for the last couple of days.  He endorses fatigue, increased urination.  Patient had his blood sugar checked at work tonight and he was high.  At arrival to the emergency department his blood sugar is 836.  He does not appear ketotic.    Patient placed on insulin drip and has done well.  Most recent blood sugar check is 218.  He was also given fluid bolus.  He does not require hospitalization at this time, will need to get back in touch with his primary care doctor for consideration of long-term treatment.  Will give prescription for Metformin for short-term.   Final Clinical Impression(s) / ED Diagnoses Final diagnoses:  Hyperglycemia    Rx / DC Orders ED Discharge Orders         Ordered    metFORMIN (GLUCOPHAGE) 500 MG tablet  2 times daily with meals        05/11/20 0520           Gilda Crease, MD 05/11/20 [phone]

## 2021-05-08 ENCOUNTER — Ambulatory Visit (HOSPITAL_COMMUNITY)
Admission: EM | Admit: 2021-05-08 | Discharge: 2021-05-08 | Disposition: A | Discharge status: Home / Self Care | Payer: BC Managed Care – PPO | Attending: Internal Medicine | Admitting: Internal Medicine

## 2021-05-08 ENCOUNTER — Encounter (HOSPITAL_COMMUNITY): Payer: Self-pay | Admitting: *Deleted

## 2021-05-08 DIAGNOSIS — M5410 Radiculopathy, site unspecified: Secondary | ICD-10-CM | POA: Diagnosis not present

## 2021-05-08 DIAGNOSIS — M546 Pain in thoracic spine: Secondary | ICD-10-CM

## 2021-05-08 DIAGNOSIS — M25512 Pain in left shoulder: Secondary | ICD-10-CM | POA: Diagnosis not present

## 2021-05-08 MED ORDER — DICLOFENAC SODIUM 75 MG PO TBEC: 75.0000 mg | DELAYED_RELEASE_TABLET | Freq: Two times a day (BID) | ORAL | 0 refills | Status: AC

## 2021-05-08 MED ORDER — TIZANIDINE HCL 4 MG PO CAPS: 4.0000 mg | ORAL_CAPSULE | Freq: Three times a day (TID) | ORAL | 0 refills | Status: AC

## 2021-05-08 NOTE — Discharge Instructions (Signed)
I believe that you have back/neck pain that is putting pressure on your nerves causing your symptoms.  Please start Zanaflex up to 3 times a day as needed.  This make you sleepy do not drive or drink alcohol while taking it.  I have also cutting diclofenac you can take twice a day.  You should not take NSAIDs including aspirin, ibuprofen/Advil, naproxen/Aleve while on this medication can cause stomach bleeding.  You can use Tylenol for breakthrough pain.  Use heat, rest, stretch for additional symptom relief.  I recommend you follow-up with your primary care provider if symptoms or not improving quickly as you may need physical therapy and/or an MRI for further evaluation of that is not something we can arrange in urgent care.

## 2021-05-08 NOTE — ED Provider Notes (Signed)
[facility]    CSN: [id number] Arrival date & time: 05/08/21  1636      History   Chief Complaint Chief Complaint  Patient presents with   Lt shoulder pain    HPI Isaac [name] is a 60 y.o. male.   Patient presents today with a 1 day history of worsening left back/shoulder pain.  Reports he has had intermittent symptoms for the past several months and then woke up this morning with severe pain prompting evaluation.  He reports pain is localized to his thoracic back with radiation into shoulder and along arm.  He does report some numbness and tingling sensation in the posterior portion of his left arm.  He is right-handed.  He denies any known injury or increase in activity prior to symptom onset.  Denies any previous back or neck injury or surgery.  He has tried over-the-counter medications including ibuprofen without improvement of symptoms.  He denies any associated chest pain, shortness of breath, diaphoresis, nausea, vomiting.  He has not seen a provider regarding this condition in the past.   Past Medical History:  Diagnosis Date   Diabetes mellitus without complication (HCC)     There are no problems to display for this patient.   History reviewed. No pertinent surgical history.     Home Medications    Prior to Admission medications   Medication Sig Start Date End Date Taking? Authorizing Provider  diclofenac (VOLTAREN) 75 MG EC tablet Take 1 tablet (75 mg total) by mouth 2 (two) times daily. 05/08/21  Yes Asjia Berrios K, PA-C  tiZANidine (ZANAFLEX) 4 MG capsule Take 1 capsule (4 mg total) by mouth 3 (three) times daily. 05/08/21  Yes Abdulrahman Bracey K, PA-C  albuterol (VENTOLIN HFA) 108 (90 Base) MCG/ACT inhaler Inhale 2 puffs into the lungs every 6 (six) hours as needed for wheezing or shortness of breath.  02/28/20   [provider]  furosemide (LASIX) 20 MG tablet Take 1 tablet (20 mg total) by mouth 2 (two) times daily. Patient taking differently:  Take 20 mg by mouth daily as needed for fluid.  11/07/18   Eustace Moore, MD  ibuprofen (ADVIL) 200 MG tablet Take 400 mg by mouth every 8 (eight) hours as needed for moderate pain.    [provider]  metFORMIN (GLUCOPHAGE) 500 MG tablet Take 1 tablet (500 mg total) by mouth 2 (two) times daily with a meal. 05/11/20   Pollina, Canary Brim, MD  metoprolol succinate (TOPROL-XL) 100 MG 24 hr tablet Take 100 mg by mouth daily. 03/30/20   [provider]    Family History History reviewed. No pertinent family history.  Social History Social History   Tobacco Use   Smoking status: Never   Smokeless tobacco: Never  Substance Use Topics   Alcohol use: No   Drug use: No     Allergies   Patient has no known allergies.   Review of Systems Review of Systems  Constitutional:  Positive for activity change. Negative for appetite change, fatigue and fever.  Respiratory:  Negative for cough and shortness of breath.   Cardiovascular:  Negative for chest pain.  Gastrointestinal:  Negative for abdominal pain, diarrhea, nausea and vomiting.  Musculoskeletal:  Positive for arthralgias, back pain, myalgias and neck pain.  Neurological:  Positive for numbness. Negative for dizziness, weakness, light-headedness and headaches.    Physical Exam Triage Vital Signs ED Triage Vitals  Enc Vitals Group     BP 05/08/21 1642 (!)  150/85     Pulse Rate 05/08/21 1642 92     Resp 05/08/21 1642 20     Temp 05/08/21 1642 98.3 F (36.8 C)     Temp src --      SpO2 05/08/21 1642 97 %     Weight --      Height --      Head Circumference --      Peak Flow --      Pain Score 05/08/21 1643 10     Pain Loc --      Pain Edu? --      Excl. in GC? --    No data found.  Updated Vital Signs BP (!) 150/85   Pulse 92   Temp 98.3 F (36.8 C)   Resp 20   SpO2 97%   Visual Acuity Right Eye Distance:   Left Eye Distance:   Bilateral Distance:    Right Eye Near:   Left Eye Near:     Bilateral Near:     Physical Exam Vitals reviewed.  Constitutional:      General: He is awake.     Appearance: Normal appearance. He is normal weight. He is not ill-appearing.     Comments: Very pleasant male appears stated age in no acute distress sitting comfortably in exam room  HENT:     Head: Normocephalic and atraumatic.     Mouth/Throat:     Pharynx: No oropharyngeal exudate, posterior oropharyngeal erythema or uvula swelling.  Cardiovascular:     Rate and Rhythm: Normal rate and regular rhythm.     Heart sounds: Normal heart sounds, S1 normal and S2 normal. No murmur heard. Pulmonary:     Effort: Pulmonary effort is normal.     Breath sounds: Normal breath sounds. No stridor. No wheezing, rhonchi or rales.     Comments: Clear to auscultation bilaterally Abdominal:     Palpations: Abdomen is soft.     Tenderness: There is no abdominal tenderness.  Musculoskeletal:     Left shoulder: No swelling or tenderness. Normal range of motion. Normal strength.     Cervical back: No tenderness or bony tenderness.     Thoracic back: Spasms and tenderness present. No bony tenderness.     Lumbar back: No tenderness or bony tenderness.       Back:     Comments: Back: Significant tenderness palpation over left thoracic paraspinal muscles.  No pain percussion of vertebrae.  Shoulder: No deformity.  Normal active range of motion but pain with overhead flexion and internal/external rotation.  Strength also 5 bilateral upper and lower extremities.  Left hand neurovascular intact.  Neurological:     Mental Status: He is alert.  Psychiatric:        Behavior: Behavior is cooperative.     UC Treatments / Results  Labs (all labs ordered are listed, but only abnormal results are displayed) Labs Reviewed - No data to display  EKG   Radiology No results found.  Procedures Procedures (including critical care time)  Medications Ordered in UC Medications - No data to display  Initial  Impression / Assessment and Plan / UC Course  I have reviewed the triage vital signs and the nursing notes.  Pertinent labs & imaging results that were available during my care of the patient were reviewed by me and considered in my medical decision making (see chart for details).      EKG obtained by triage showed normal sinus rhythm with nonspecific  ST changes in V1 to V6 compared to 05/11/2020 tracing but no ischemic changes.  Discussed that symptoms are musculoskeletal in nature given tenderness palpation on exam and pain with movement.  No bony tenderness on exam so x-rays were not obtained.  He was started on Zanaflex with instruction to drive drink alcohol taking this medication as drowsiness is a common side effect.  He was prescribed diclofenac with instructions to take additional NSAIDs due to risk of GI bleeding.  Encouraged him to use heat, active range of motion, stretch for symptom relief.  Discussed that if symptoms persist he would benefit from seeing a sports medicine provider to consider physical therapy and/or MRI for further evaluation.  Discussed alarm symptoms that warrant emergent evaluation.  Strict return precautions given to which patient expressed understanding.  Final Clinical Impressions(s) / UC Diagnoses   Final diagnoses:  Acute left-sided thoracic back pain  Acute pain of left shoulder  Radiculopathy affecting upper extremity     Discharge Instructions      I believe that you have back/neck pain that is putting pressure on your nerves causing your symptoms.  Please start Zanaflex up to 3 times a day as needed.  This make you sleepy do not drive or drink alcohol while taking it.  I have also cutting diclofenac you can take twice a day.  You should not take NSAIDs including aspirin, ibuprofen/Advil, naproxen/Aleve while on this medication can cause stomach bleeding.  You can use Tylenol for breakthrough pain.  Use heat, rest, stretch for additional symptom relief.   I recommend you follow-up with your primary care provider if symptoms or not improving quickly as you may need physical therapy and/or an MRI for further evaluation of that is not something we can arrange in urgent care.     ED Prescriptions     Medication Sig Dispense Auth. Provider   tiZANidine (ZANAFLEX) 4 MG capsule Take 1 capsule (4 mg total) by mouth 3 (three) times daily. 30 capsule Howard Patton K, PA-C   diclofenac (VOLTAREN) 75 MG EC tablet Take 1 tablet (75 mg total) by mouth 2 (two) times daily. 20 tablet Adely Facer, Noberto Retort, PA-C      PDMP not reviewed this encounter.   Jeani Hawking, PA-C 05/08/21 1810

## 2021-05-08 NOTE — ED Triage Notes (Signed)
Pt reports Lt shoulder pain for one month . Pt reports he woke up at 1000 today and the Lt shoulder Pain is worse. Pt has full range of motion on Lt arm. Pt denies any injury or surgery to Lt shoulder. Pt denies any falls.
